# Patient Record
Sex: Female | Born: 1985 | Hispanic: Yes | Marital: Single | Smoking: Never smoker
Health system: Southern US, Community
[De-identification: ages and names within clinical notes are randomized; demographics above are authoritative.]

## PROBLEM LIST (undated history)

## (undated) DIAGNOSIS — E785 Hyperlipidemia, unspecified: Secondary | ICD-10-CM

## (undated) DIAGNOSIS — D649 Anemia, unspecified: Secondary | ICD-10-CM

## (undated) DIAGNOSIS — K649 Unspecified hemorrhoids: Secondary | ICD-10-CM

## (undated) HISTORY — DX: Hyperlipidemia, unspecified: E78.5

## (undated) HISTORY — DX: Unspecified hemorrhoids: K64.9

## (undated) HISTORY — PX: NO PAST SURGERIES: SHX2092

## (undated) HISTORY — DX: Anemia, unspecified: D64.9

---

## 2007-07-04 LAB — OB RESULTS CONSOLE RUBELLA ANTIBODY, IGM: Rubella: IMMUNE

## 2007-07-04 LAB — OB RESULTS CONSOLE VARICELLA ZOSTER ANTIBODY, IGG: Varicella: IMMUNE

## 2007-07-04 LAB — SICKLE CELL SCREEN: Sickle Cell Screen: NORMAL

## 2007-08-22 ENCOUNTER — Encounter: Payer: Self-pay | Admitting: Maternal & Fetal Medicine

## 2008-02-02 ENCOUNTER — Inpatient Hospital Stay: Payer: Self-pay

## 2008-02-12 IMAGING — US US OB DETAIL+14 WK - NRPT [facility]
1 series · 18 of 28 positions shown · non-contrast
Comparison: none

[Series 1: us ob detail+14 wk - nrpt [facility] · 18 of 91 slices shown]
[im 1/91]
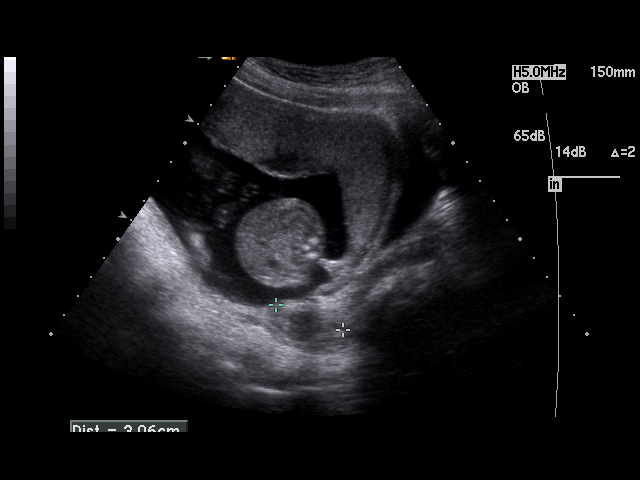
[im 7/91]
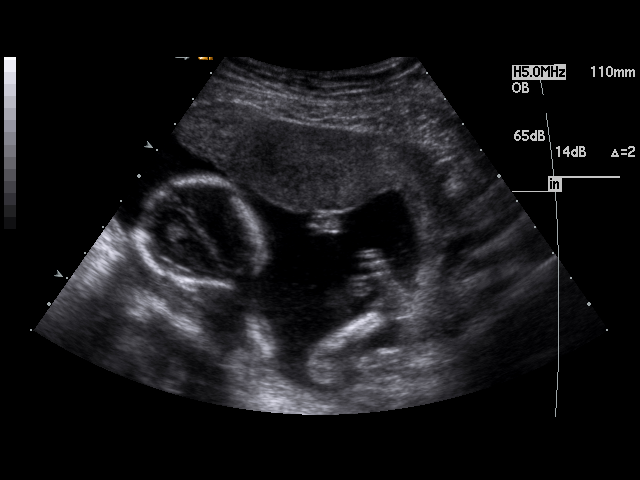
[im 11/91]
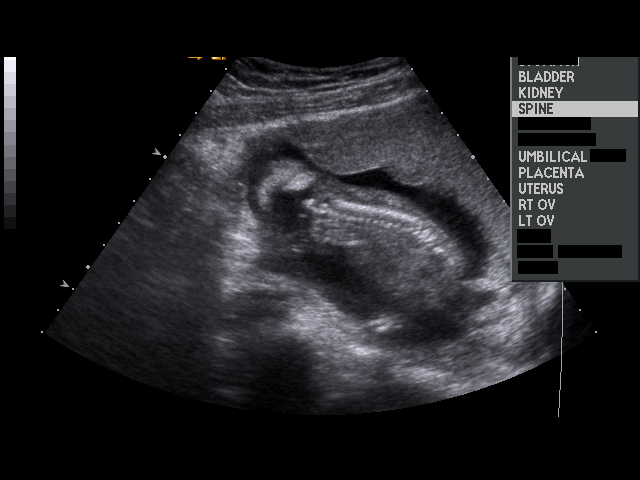
[im 17/91]
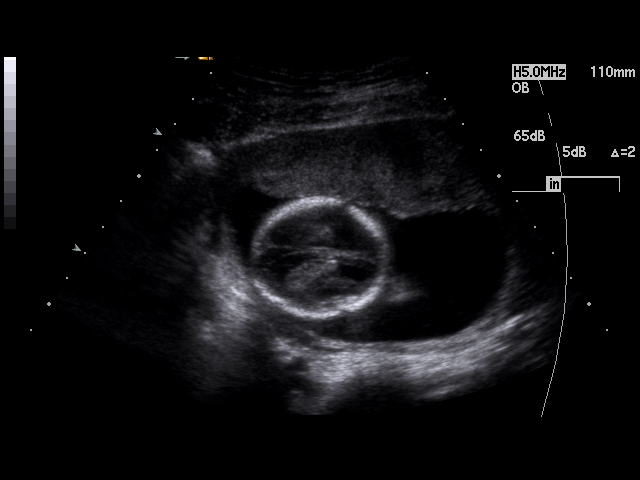
[im 24/91]
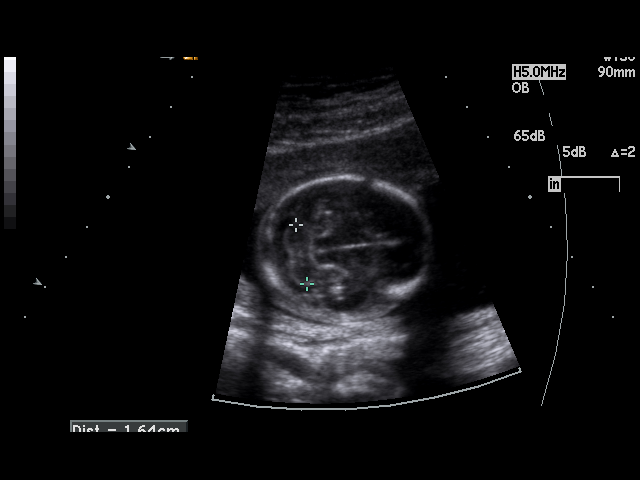
[im 27/91]
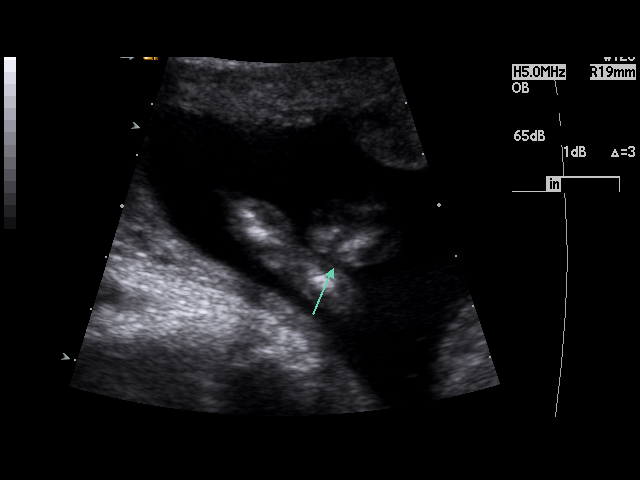
[im 34/91]
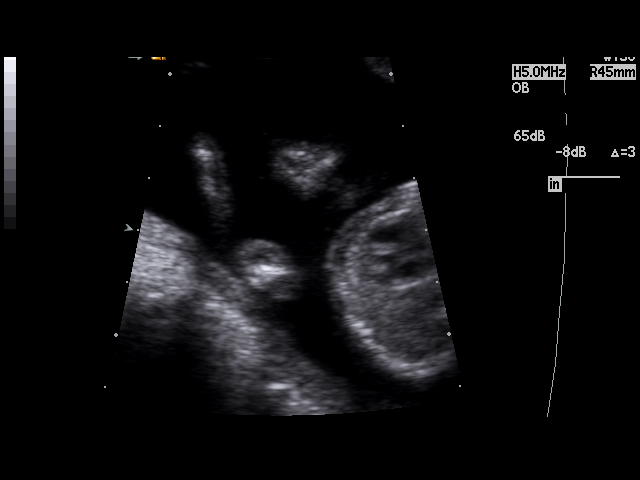
[im 37/91]
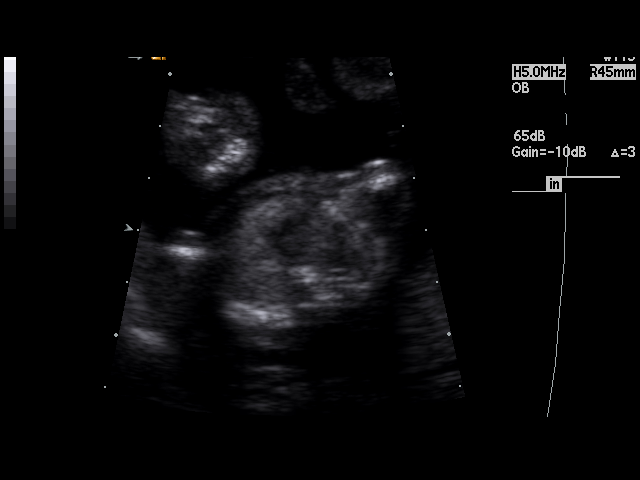
[im 44/91]
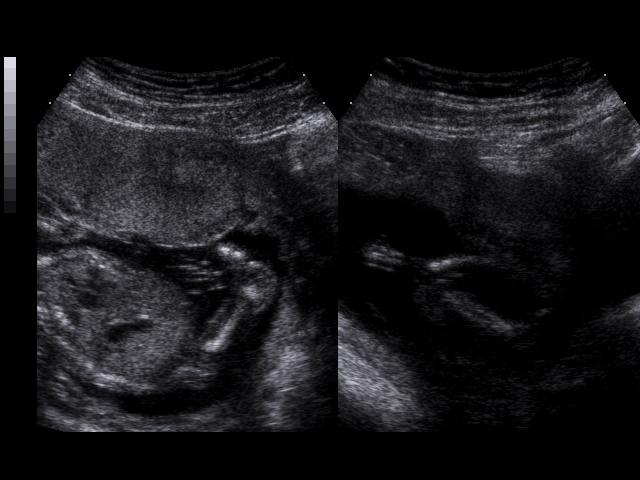
[im 47/91]
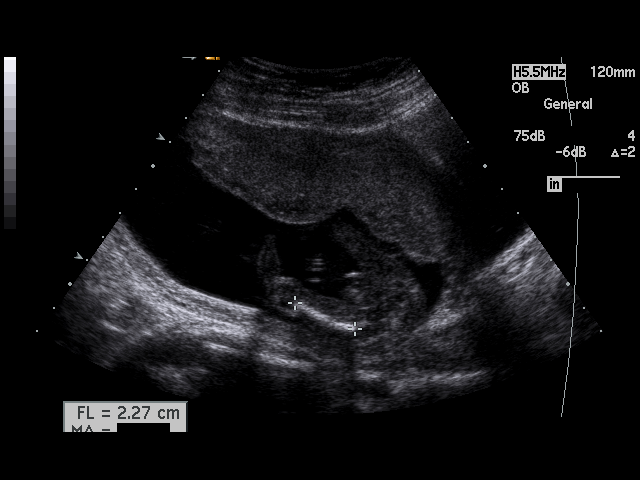
[im 54/91]
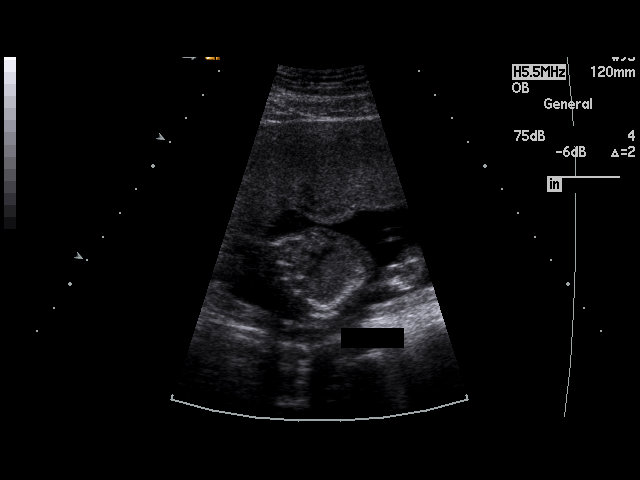
[im 57/91]
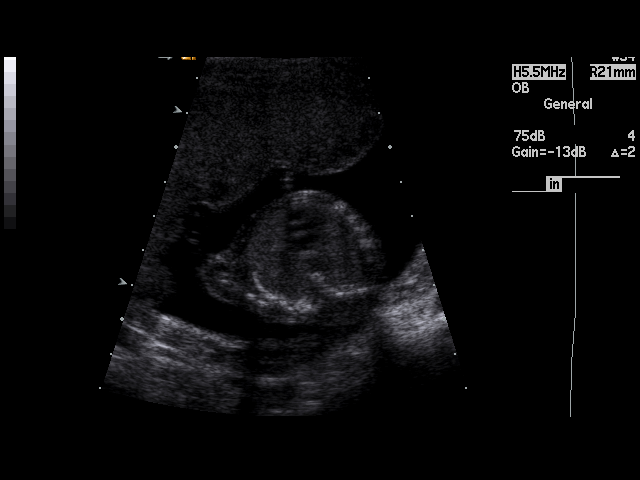
[im 64/91]
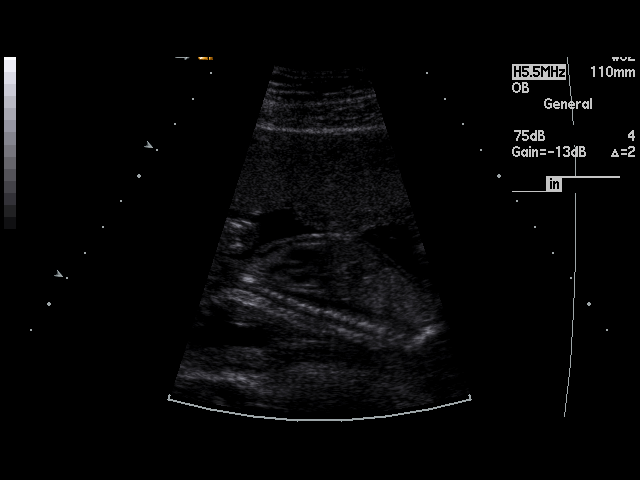
[im 71/91]
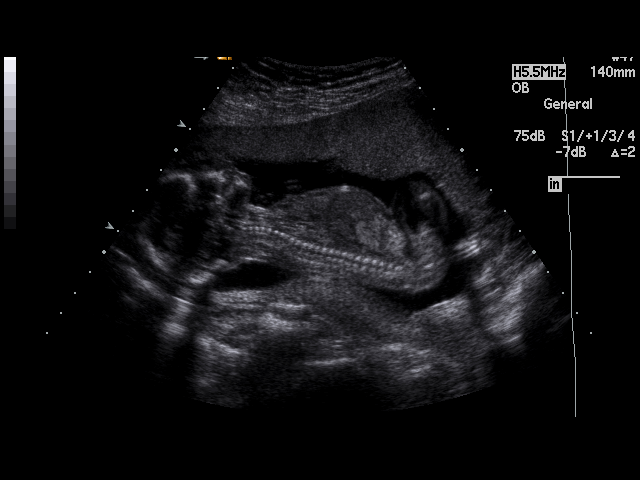
[im 74/91]
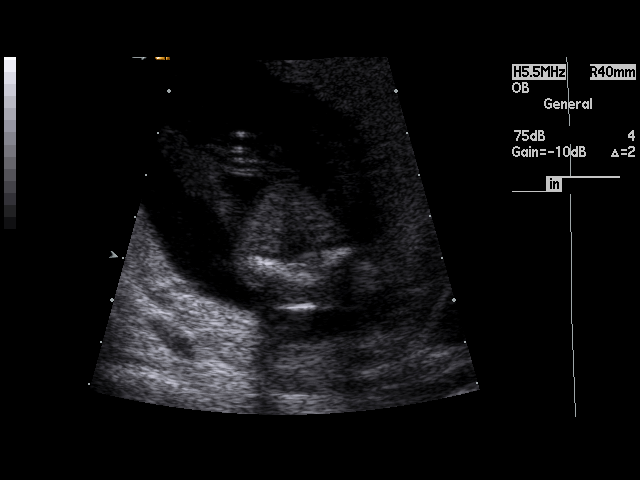
[im 81/91]
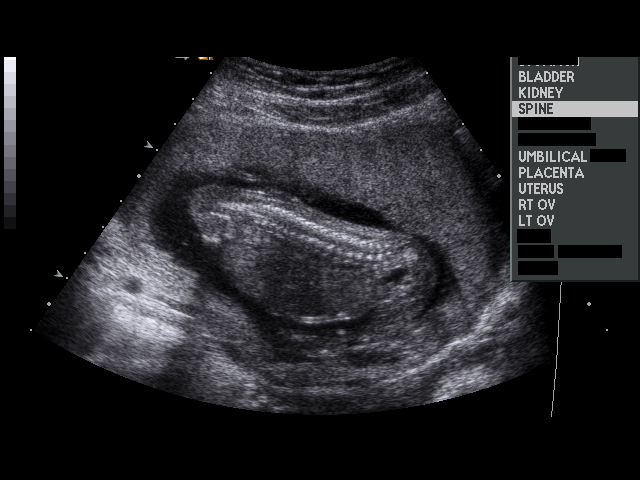
[im 84/91]
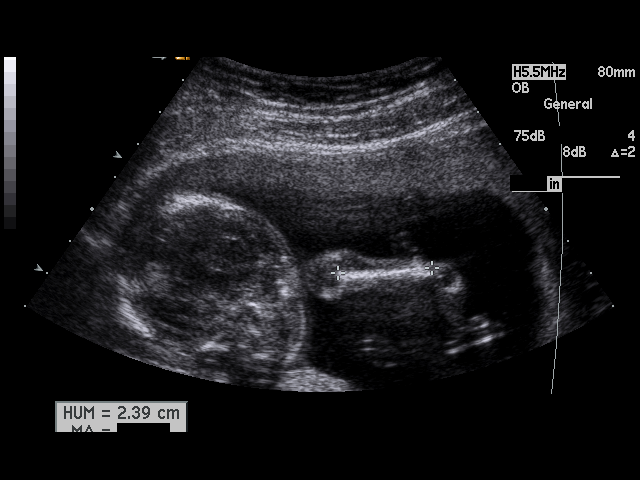
[im 91/91]
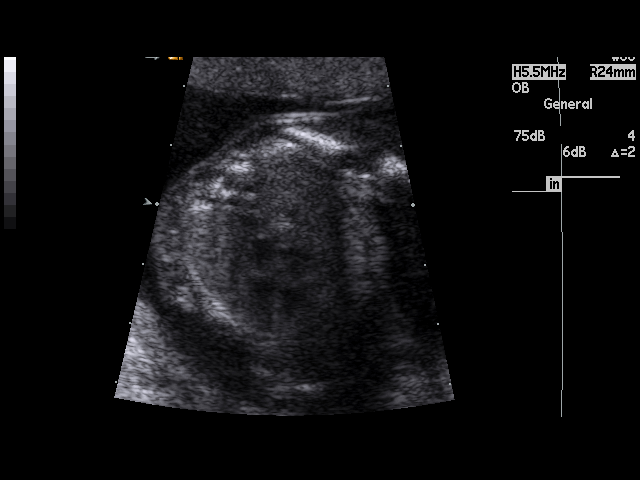

[18 of 28 positions shown; findings below may reference images not displayed]

IMAGES IMPORTED FROM THE SYNGO WORKFLOW SYSTEM
NO DICTATION FOR STUDY

## 2012-08-12 ENCOUNTER — Emergency Department: Payer: Self-pay | Admitting: Emergency Medicine

## 2012-08-12 LAB — URINALYSIS, COMPLETE
Bilirubin,UR: NEGATIVE
Ketone: NEGATIVE
Leukocyte Esterase: NEGATIVE
Nitrite: NEGATIVE
Protein: NEGATIVE
RBC,UR: <1 /HPF (ref 0–5)
Specific Gravity: 1.003 (ref 1.003–1.030)
Squamous Epithelial: <1
WBC UR: <1 /HPF (ref 0–5)

## 2012-08-12 LAB — CBC
HGB: 11.6 g/dL — ABNORMAL LOW (ref 12.0–16.0)
MCHC: 33.6 g/dL (ref 32.0–36.0)
MCV: 88 fL (ref 80–100)
Platelet: 261 10*3/uL (ref 150–440)
RDW: 13.5 % (ref 11.5–14.5)

## 2012-08-26 LAB — OB RESULTS CONSOLE ABO/RH

## 2012-08-26 LAB — OB RESULTS CONSOLE ANTIBODY SCREEN: Antibody Screen: NEGATIVE

## 2012-11-11 ENCOUNTER — Ambulatory Visit: Payer: Self-pay | Admitting: Family Medicine

## 2013-03-18 ENCOUNTER — Ambulatory Visit: Payer: Self-pay | Admitting: Advanced Practice Midwife

## 2013-04-02 ENCOUNTER — Inpatient Hospital Stay: Payer: Self-pay

## 2013-04-02 LAB — CBC WITH DIFFERENTIAL/PLATELET
Basophil #: 0 10*3/uL (ref 0.0–0.1)
Eosinophil #: 0.1 10*3/uL (ref 0.0–0.7)
HCT: 33.3 % — ABNORMAL LOW (ref 35.0–47.0)
HGB: 11.4 g/dL — ABNORMAL LOW (ref 12.0–16.0)
Lymphocyte %: 16.1 %
MCH: 30.6 pg (ref 26.0–34.0)
MCHC: 34.3 g/dL (ref 32.0–36.0)
MCV: 89 fL (ref 80–100)
Monocyte #: 0.7 x10 3/mm (ref 0.2–0.9)
Monocyte %: 5.5 %
RBC: 3.74 10*6/uL — ABNORMAL LOW (ref 3.80–5.20)
RDW: 13.9 % (ref 11.5–14.5)
WBC: 12.1 10*3/uL — ABNORMAL HIGH (ref 3.6–11.0)

## 2013-04-03 LAB — HEMOGLOBIN: HGB: 10 g/dL — ABNORMAL LOW (ref 12.0–16.0)

## 2014-01-21 LAB — HM HIV SCREENING LAB: HM HIV Screening: NEGATIVE

## 2017-06-08 LAB — HM PAP SMEAR: HM Pap smear: NEGATIVE

## 2017-07-25 ENCOUNTER — Ambulatory Visit: Payer: Self-pay | Attending: Oncology | Admitting: *Deleted

## 2017-07-25 ENCOUNTER — Ambulatory Visit
Admission: RE | Admit: 2017-07-25 | Discharge: 2017-07-25 | Disposition: A | Discharge status: Home / Self Care | Payer: Self-pay | Source: Ambulatory Visit | Attending: Oncology | Admitting: Oncology

## 2017-07-25 ENCOUNTER — Other Ambulatory Visit: Payer: Self-pay | Admitting: *Deleted

## 2017-07-25 ENCOUNTER — Encounter: Payer: Self-pay | Admitting: *Deleted

## 2017-07-25 ENCOUNTER — Encounter (INDEPENDENT_AMBULATORY_CARE_PROVIDER_SITE_OTHER): Payer: Self-pay

## 2017-07-25 VITALS — BP 101/66 | HR 71 | Temp 98.0°F | Ht 63.0 in | Wt 143.0 lb

## 2017-07-25 DIAGNOSIS — N63 Unspecified lump in unspecified breast: Secondary | ICD-10-CM

## 2017-07-25 DIAGNOSIS — N644 Mastodynia: Secondary | ICD-10-CM

## 2017-07-25 NOTE — Progress Notes (Signed)
Subjective:     Patient ID: Jordan Mccarthy, female   DOB: Mar 21, 1986, 32 y.o.   MRN: [id number]  HPI   Review of Systems     Objective:   Physical Exam  Pulmonary/Chest: Right breast exhibits tenderness. Right breast exhibits no inverted nipple, no mass, no nipple discharge and no skin change. Left breast exhibits no inverted nipple, no mass, no nipple discharge, no skin change and no tenderness. Breasts are symmetrical.         Assessment:     32 year old Hispanic female referred to [facility] by Jordan DallyKarla Leith, FNP at the [facility] for a right breast mass at 9:00.  Jordan Mccarthy, the interpreter present during the interview and exam.  On clinical breast exam bilateral breast have soft mobile fibroglandular like tissue at bilateral upper outer quadrants.  The right breast is tender to palpation.  I could not palpate a dominant mass.  Asked the patient to point to the area of concern.  She points to area of fibroglandular like tissue at 10:00 right breast.  Had the patient feel the symmetry between bilateral breast tissue.  She does state the right breast is more tender than the left to palpation.  States she has told her provider for 5 years about the pain and nodule, and this provider could also feel the nodule.  States she has had intermittent right breast pain for 5 years.  States it does not correlate to her menstrual cycle.  Taught self breast awareness.  Patient has been screened for eligibility.  She does not have any insurance, Medicare or Medicaid.  She also meets financial eligibility.  Hand-out given on the Affordable Care Act.    Plan:     Since the patient has targeted right breast pain, I will go ahead and get a ultrasound based on patient's age and protocol.  If the radiologist feels it necessary for an ultrasound we can order one.  Explained that if no findings on imaging, I feel the pain and nodule she is feeling is probably fibroglandular tissue.  Will follow-up per [facility] protocol.

## 2017-07-25 NOTE — Patient Instructions (Signed)
Gave patient hand-out, Women Staying Healthy, Active and Well from [facility], with education on breast health, pap smears, heart and colon health. 

## 2017-08-02 ENCOUNTER — Ambulatory Visit
Admission: RE | Admit: 2017-08-02 | Discharge: 2017-08-02 | Disposition: A | Discharge status: Home / Self Care | Payer: Self-pay | Source: Ambulatory Visit | Attending: Oncology | Admitting: Oncology

## 2017-08-02 DIAGNOSIS — N63 Unspecified lump in unspecified breast: Secondary | ICD-10-CM

## 2017-08-03 ENCOUNTER — Encounter: Payer: Self-pay | Admitting: *Deleted

## 2017-08-03 LAB — SURGICAL PATHOLOGY

## 2017-08-03 NOTE — Progress Notes (Signed)
Patient with benign breast biopsy.  Faxed results to provider via Epic.  Patient to return to [facility] as needed.  [facility] to [place].

## 2018-04-06 ENCOUNTER — Encounter: Payer: Self-pay | Admitting: Emergency Medicine

## 2018-04-06 ENCOUNTER — Other Ambulatory Visit: Payer: Self-pay

## 2018-04-06 ENCOUNTER — Emergency Department
Admission: EM | Admit: 2018-04-06 | Discharge: 2018-04-06 | Disposition: A | Discharge status: Home / Self Care | Payer: No Typology Code available for payment source | Attending: Emergency Medicine | Admitting: Emergency Medicine

## 2018-04-06 ENCOUNTER — Emergency Department: Payer: No Typology Code available for payment source

## 2018-04-06 DIAGNOSIS — Y939 Activity, unspecified: Secondary | ICD-10-CM | POA: Insufficient documentation

## 2018-04-06 DIAGNOSIS — M542 Cervicalgia: Secondary | ICD-10-CM | POA: Insufficient documentation

## 2018-04-06 DIAGNOSIS — Y998 Other external cause status: Secondary | ICD-10-CM | POA: Diagnosis not present

## 2018-04-06 DIAGNOSIS — M545 Low back pain: Secondary | ICD-10-CM | POA: Diagnosis not present

## 2018-04-06 DIAGNOSIS — Y92410 Unspecified street and highway as the place of occurrence of the external cause: Secondary | ICD-10-CM | POA: Insufficient documentation

## 2018-04-06 LAB — POCT PREGNANCY, URINE: PREG TEST UR: NEGATIVE

## 2018-04-06 MED ORDER — CYCLOBENZAPRINE HCL 5 MG PO TABS: 5.0000 mg | ORAL_TABLET | Freq: Three times a day (TID) | ORAL | 0 refills | Status: AC | PRN

## 2018-04-06 MED ORDER — MELOXICAM 15 MG PO TABS: 15.0000 mg | ORAL_TABLET | Freq: Every day | ORAL | 1 refills | Status: AC

## 2018-04-06 MED ORDER — KETOROLAC TROMETHAMINE 30 MG/ML IJ SOLN
30.0000 mg | Freq: Once | INTRAMUSCULAR | Status: AC
  Administered 2018-04-06: 30 mg via INTRAMUSCULAR
  Filled 2018-04-06: qty 1
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
## 2018-04-15 ENCOUNTER — Other Ambulatory Visit: Payer: Self-pay

## 2018-04-15 ENCOUNTER — Emergency Department
Admission: EM | Admit: 2018-04-15 | Discharge: 2018-04-15 | Disposition: A | Discharge status: Home / Self Care | Payer: Self-pay | Attending: Emergency Medicine | Admitting: Emergency Medicine

## 2018-04-15 DIAGNOSIS — M545 Low back pain: Secondary | ICD-10-CM | POA: Insufficient documentation

## 2018-04-15 DIAGNOSIS — M25512 Pain in left shoulder: Secondary | ICD-10-CM | POA: Insufficient documentation

## 2018-04-15 DIAGNOSIS — M549 Dorsalgia, unspecified: Secondary | ICD-10-CM | POA: Insufficient documentation

## 2018-04-15 DIAGNOSIS — M7918 Myalgia, other site: Secondary | ICD-10-CM | POA: Insufficient documentation

## 2018-04-15 MED ORDER — CYCLOBENZAPRINE HCL 10 MG PO TABS: 10.0000 mg | ORAL_TABLET | Freq: Three times a day (TID) | ORAL | 0 refills | Status: DC | PRN

## 2018-04-15 MED ORDER — TRAMADOL HCL 50 MG PO TABS: 50.0000 mg | ORAL_TABLET | Freq: Four times a day (QID) | ORAL | 0 refills | Status: AC | PRN
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
## 2018-05-16 ENCOUNTER — Encounter: Payer: Self-pay | Admitting: Obstetrics & Gynecology

## 2019-05-15 ENCOUNTER — Other Ambulatory Visit: Payer: Self-pay

## 2019-05-15 ENCOUNTER — Ambulatory Visit (LOCAL_COMMUNITY_HEALTH_CENTER): Payer: Self-pay

## 2019-05-15 VITALS — BP 95/66 | Ht 63.0 in | Wt 146.0 lb

## 2019-05-15 DIAGNOSIS — Z30011 Encounter for initial prescription of contraceptive pills: Secondary | ICD-10-CM

## 2019-05-15 DIAGNOSIS — Z3009 Encounter for other general counseling and advice on contraception: Secondary | ICD-10-CM

## 2019-05-15 MED ORDER — NORGESTIMATE-ETH ESTRADIOL 0.25-35 MG-MCG PO TABS: 1.0000 | ORAL_TABLET | Freq: Every day | ORAL | 11 refills | Status: DC

## 2019-05-15 MED ORDER — NORGESTIMATE-ETH ESTRADIOL 0.25-35 MG-MCG PO TABS: 1.0000 | ORAL_TABLET | Freq: Every day | ORAL | 0 refills | Status: DC

## 2019-05-15 NOTE — Progress Notes (Signed)
Client presents for additional ocps and physical overdue. Consult with E. Sciora CNM and following orders given: 1) may dispense Sprintec #3 and take one pil QD at same time every day and 2) client to schedule appt for physical when begins last pack of ocps. If agency not yet doing physicals, client to request appt with provider to discuss BCM. Client counseled regarding above and verbalized understanding. Rich Number, RN

## 2019-08-13 ENCOUNTER — Encounter: Payer: Self-pay | Admitting: Advanced Practice Midwife

## 2019-08-13 ENCOUNTER — Other Ambulatory Visit: Payer: Self-pay

## 2019-08-13 ENCOUNTER — Ambulatory Visit (LOCAL_COMMUNITY_HEALTH_CENTER): Payer: Self-pay | Admitting: Advanced Practice Midwife

## 2019-08-13 VITALS — BP 94/69 | Ht 63.0 in | Wt 154.4 lb

## 2019-08-13 DIAGNOSIS — E663 Overweight: Secondary | ICD-10-CM

## 2019-08-13 DIAGNOSIS — Z3041 Encounter for surveillance of contraceptive pills: Secondary | ICD-10-CM

## 2019-08-13 DIAGNOSIS — Z3009 Encounter for other general counseling and advice on contraception: Secondary | ICD-10-CM

## 2019-08-13 MED ORDER — NORGESTIMATE-ETH ESTRADIOL 0.25-35 MG-MCG PO TABS: 1.0000 | ORAL_TABLET | Freq: Every day | ORAL | 2 refills | Status: DC

## 2019-08-13 MED ORDER — NORGESTIMATE-ETH ESTRADIOL 0.25-35 MG-MCG PO TABS
1.0000 | ORAL_TABLET | Freq: Every day | ORAL | 11 refills | Status: DC
Start: 2019-08-13 — End: 2019-08-13

## 2019-08-13 NOTE — Progress Notes (Signed)
   [facility] problem visit  [facility]  Subjective:  Jordan Mccarthy is a 34 y.o. SHF G2P2 nonsmoker being seen today for more ocp's  Chief Complaint  Patient presents with  . Contraception    HPI Last sex 08/10/19.  Forgot to take ocp's on 08/09/19 and 08/10/19; took 2 ocp's 08/11/19.  LMP 07/25/19. Took last ocp last night. Pap due 05/2020  Does the patient have a current or past history of drug use? No   No components found for: HCV]   Health Maintenance Due  Topic Date Due  . TETANUS/TDAP  01/11/2007  . INFLUENZA VACCINE  02/22/2019    ROS  The following portions of the patient's history were reviewed and updated as appropriate: allergies, current medications, past family history, past medical history, past social history, past surgical history and problem list. Problem list updated.   See flowsheet for other program required questions.  Objective:   Vitals:   08/13/19 1550  BP: 94/69  Weight: 154 lb 6.4 oz (70 kg)  Height: 5\' 3"  (1.6 m)    Physical Exam  n/a  Assessment and Plan:  Jordan Mccarthy is a 34 y.o. female presenting to the [facility] Department for a Women's Health problem visit  1. Family planning  - norgestimate-ethinyl estradiol (ORTHO-CYCLEN, 28,) 0.25-35 MG-MCG tablet; Take 1 tablet by mouth daily.  Dispense: 1 Package; Refill: 2  2. Encounter for surveillance of contraceptive pills Ortho Cyclen #3 I po daily; pt counseled to take ocp same time daily.   Pt counseled if no menses at end of this pack then needs to do PT and call if + - norgestimate-ethinyl estradiol (ORTHO-CYCLEN, 28,) 0.25-35 MG-MCG tablet; Take 1 tablet by mouth daily.  Dispense: 1 Package; Refill: 2  3. Overweight      Return in about 3 months (around 11/11/2019), or Need to make sure is having menses and not pregnant (noncompliant).  No future appointments.  11/13/2019, CNM

## 2019-08-13 NOTE — Progress Notes (Signed)
In for more O.C.s; reports skipped pills Sat & Wynelle Link this week and took 2 on Monday Sharlette Dense, RN

## 2019-08-22 IMAGING — US US BREAST*R* LIMITED INC AXILLA
1 series · 9 of 9 positions shown · non-contrast
Comparison: None available.

CLINICAL DATA: Right breast upper outer quadrant focal pain.

EXAM:
ULTRASOUND OF THE RIGHT BREAST

[Series 1: us breast*right* limited inc axilla · 0.05mm/px · 9 of 9 slices shown]
[im 1/9]
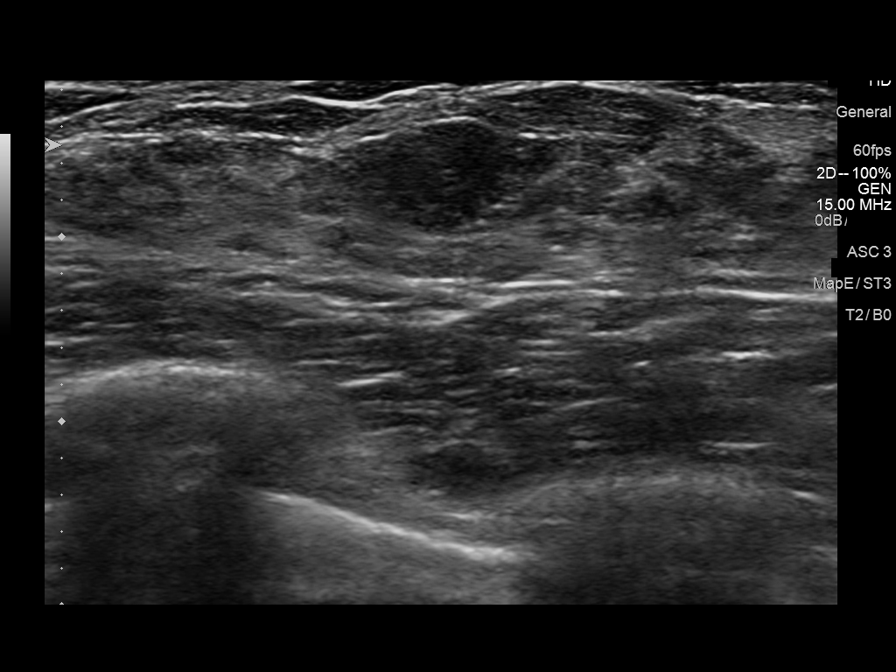
[im 2/9]
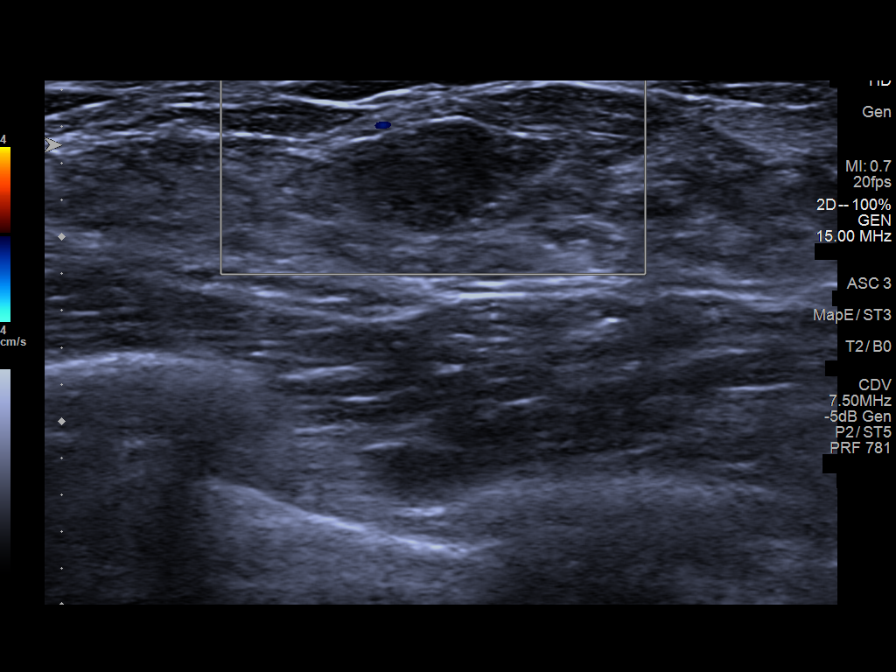
[im 3/9]
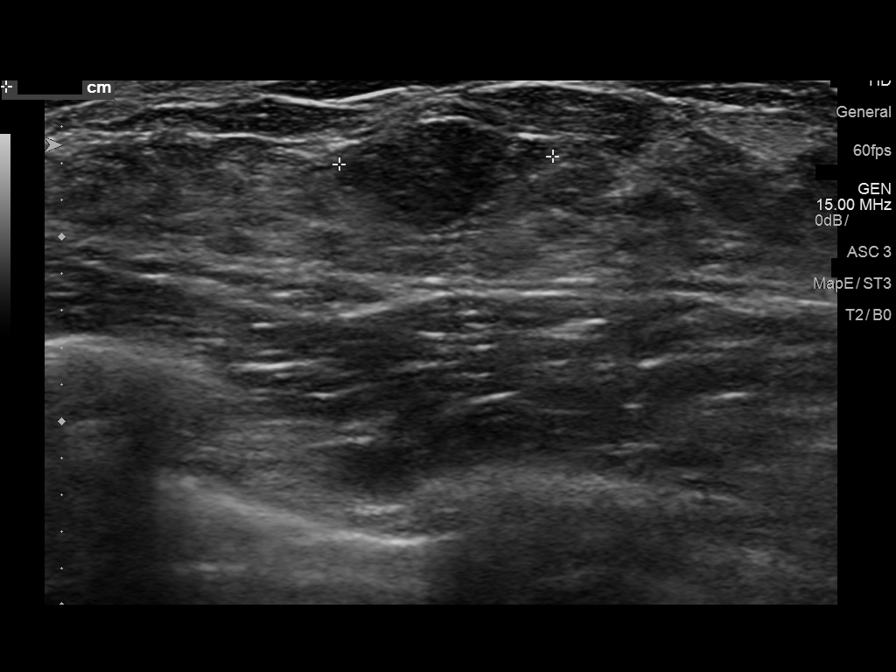
[im 4/9]
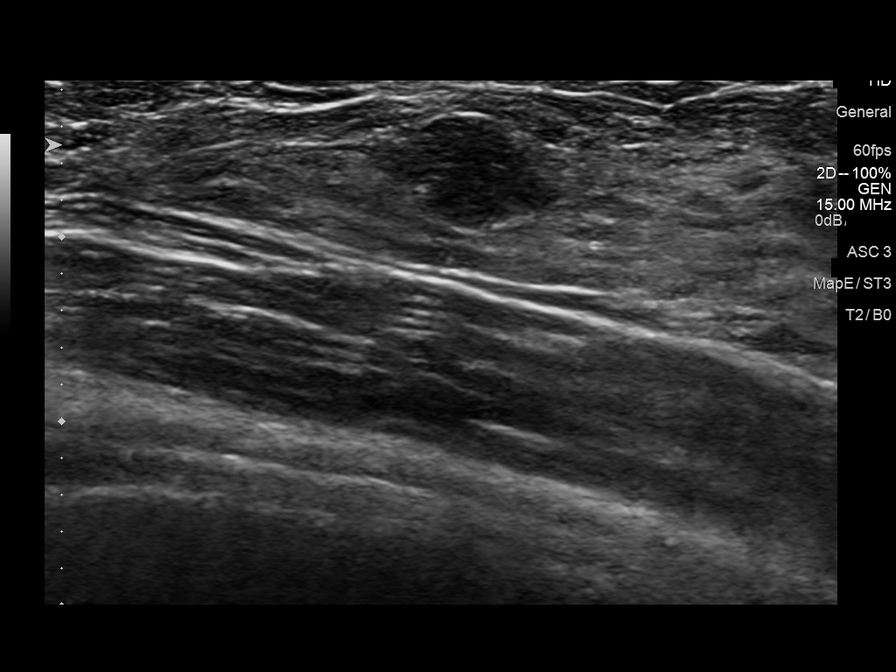
[im 5/9]
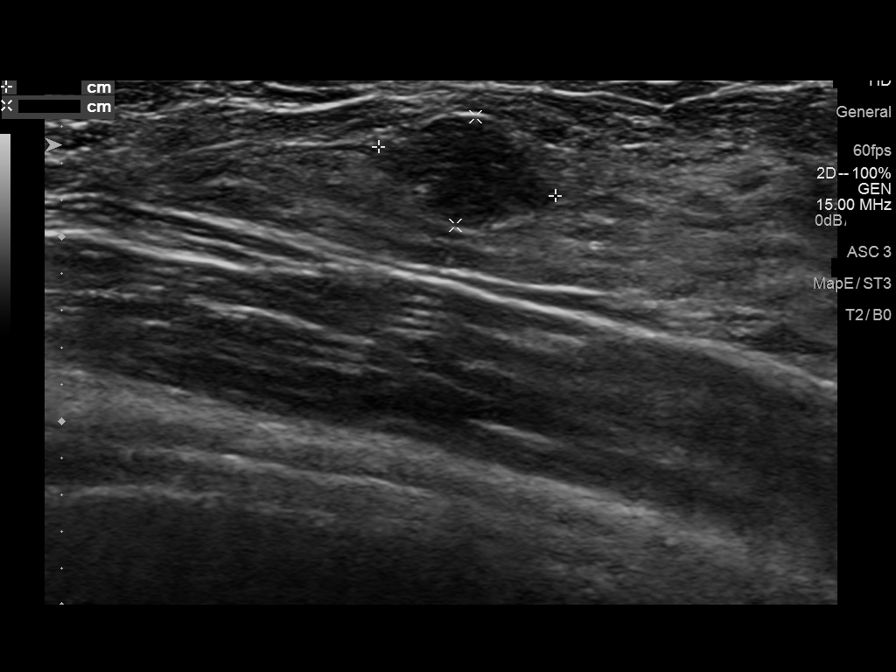
[im 6/9]
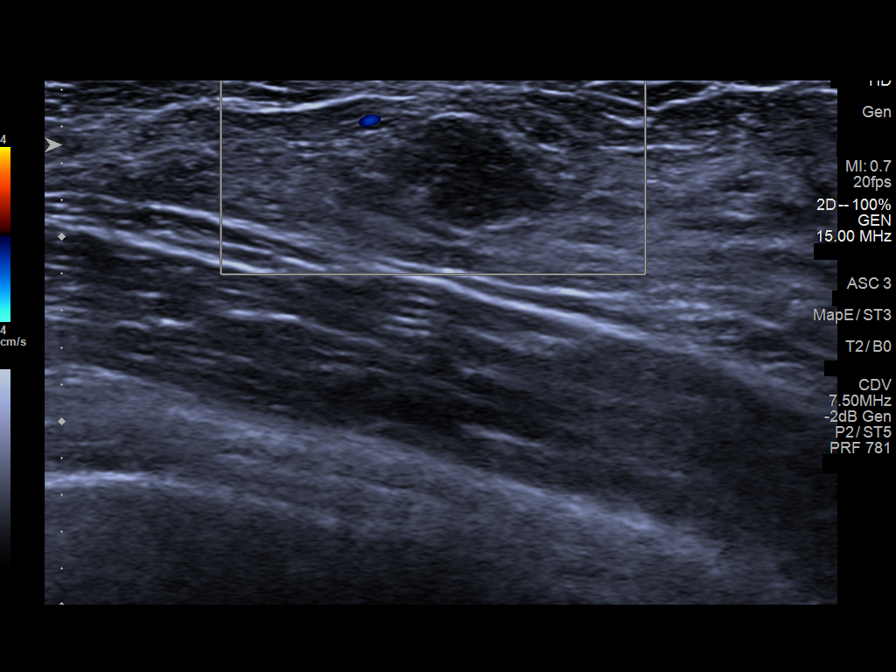
[im 7/9]
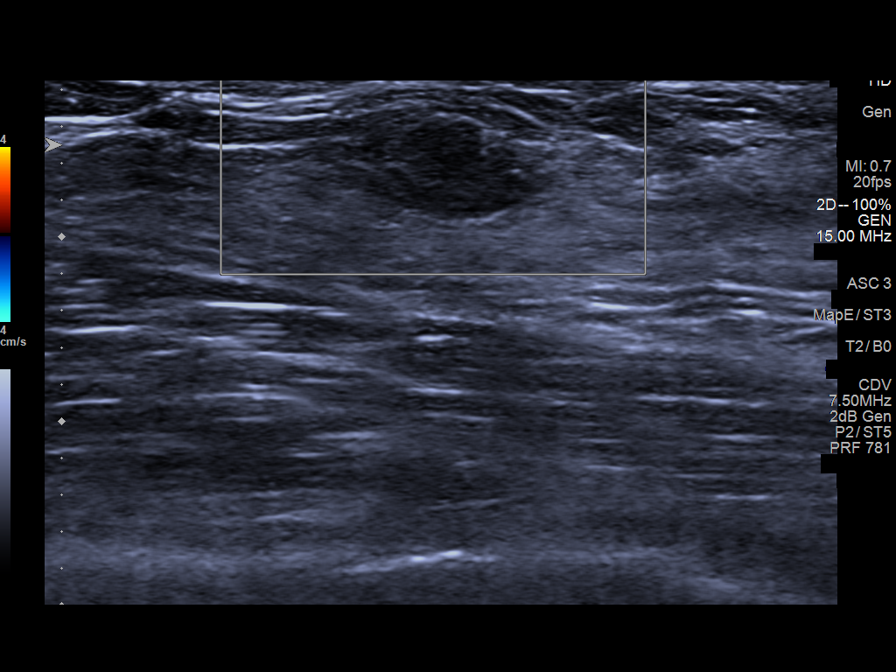
[im 8/9]
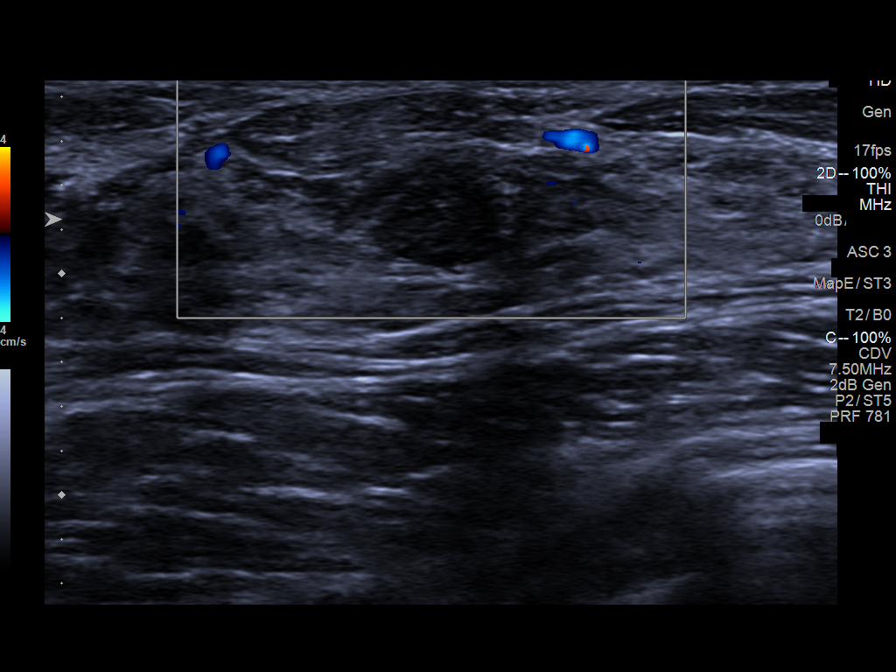
[im 9/9]
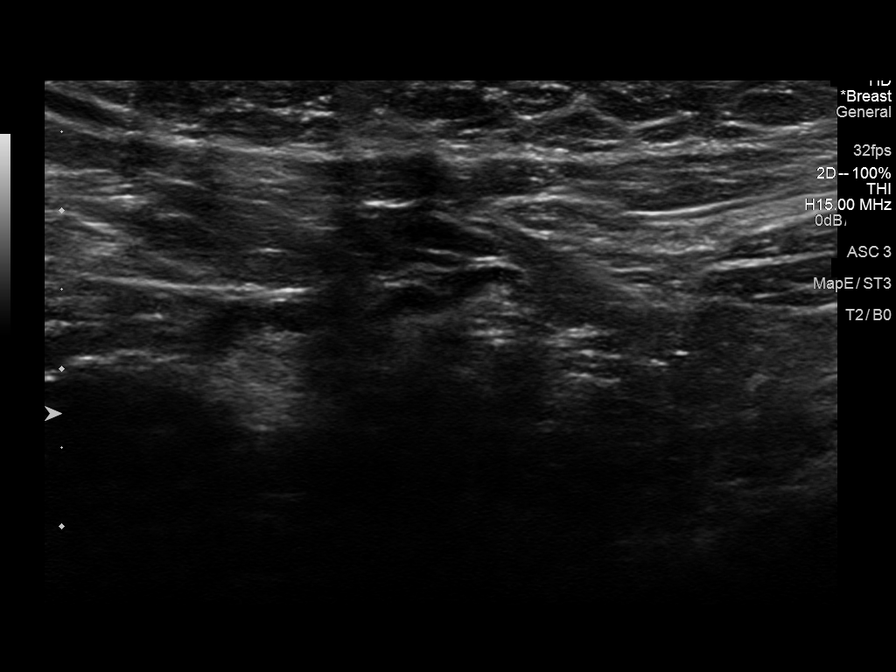

[9 of 9 positions shown; findings below may reference images not displayed]

FINDINGS: On physical exam, no suspicious masses are palpated.

Targeted ultrasound is performed, showing right breast 10 o'clock 6
cm from the nipple hypoechoic mostly circumscribed mass measuring
1.0 x 0.6 x 1.2 cm. No internal vascularity is documented.
IMPRESSION: Right breast 10 o'clock indeterminate mass, which corresponds to the
area of focal tenderness described by the patient, for which
ultrasound-guided core needle biopsy is recommended.

RECOMMENDATION:
Ultrasound-guided core needle biopsy of the right breast.

I have discussed the findings and recommendations with the patient.
Results were also provided in writing at the conclusion of the
visit. If applicable, a reminder letter will be sent to the patient
regarding the next appointment.

BI-RADS CATEGORY  4: Suspicious.

## 2019-10-22 ENCOUNTER — Other Ambulatory Visit: Payer: Self-pay | Admitting: Physician Assistant

## 2019-10-22 DIAGNOSIS — Z3041 Encounter for surveillance of contraceptive pills: Secondary | ICD-10-CM

## 2019-10-22 MED ORDER — NORGESTIMATE-ETH ESTRADIOL 0.25-35 MG-MCG PO TABS: 1.0000 | ORAL_TABLET | Freq: Every day | ORAL | 0 refills | Status: DC

## 2019-10-22 NOTE — Progress Notes (Signed)
Per chart review, last RP 04/2018.  Pap due 05/2020.  Provided that patient desires to continue with OCs and BP is normal, may have Sprintec(Ortho Cyclen) #8 to last until 05/2020, when she will need to have provider visit to have pap.

## 2019-10-23 ENCOUNTER — Ambulatory Visit: Payer: Self-pay

## 2019-10-23 ENCOUNTER — Other Ambulatory Visit: Payer: Self-pay

## 2019-10-23 VITALS — BP 107/68 | Ht 63.0 in | Wt 153.5 lb

## 2019-10-23 DIAGNOSIS — Z30011 Encounter for initial prescription of contraceptive pills: Secondary | ICD-10-CM

## 2019-10-23 DIAGNOSIS — Z3009 Encounter for other general counseling and advice on contraception: Secondary | ICD-10-CM

## 2019-10-23 MED ORDER — NORGESTIMATE-ETH ESTRADIOL 0.25-35 MG-MCG PO TABS: 1.0000 | ORAL_TABLET | Freq: Every day | ORAL | 2 refills | Status: DC

## 2019-10-23 NOTE — Progress Notes (Signed)
Client to received 8 packs of Sprintec at appt today per 10/22/2019 written order of [facility]. Due to expiration date, only able to dispense 6 packs. Client aware to return when begins last pack of pills and may receive 2 additional packs at that time. Client also counseled that will need provider appt in 05/2020 as PAP due. Jossie Ng, RN

## 2020-01-12 ENCOUNTER — Ambulatory Visit (LOCAL_COMMUNITY_HEALTH_CENTER): Payer: Self-pay

## 2020-01-12 ENCOUNTER — Other Ambulatory Visit: Payer: Self-pay

## 2020-01-12 VITALS — BP 107/71 | Ht 63.0 in | Wt 154.5 lb

## 2020-01-12 DIAGNOSIS — Z3201 Encounter for pregnancy test, result positive: Secondary | ICD-10-CM

## 2020-01-12 LAB — PREGNANCY, URINE: Preg Test, Ur: POSITIVE — AB

## 2020-01-12 MED ORDER — PRENATAL 27-0.8 MG PO TABS: 1.0000 | ORAL_TABLET | Freq: Every day | ORAL | 0 refills | Status: DC

## 2020-01-12 NOTE — Progress Notes (Signed)
UPT positive. Plans care at [facility]. To clerk for Medicaid for Preg. Women and to schedule prenatal appt. Jerel Shepherd, RN

## 2020-02-04 ENCOUNTER — Telehealth: Payer: Self-pay | Admitting: Family Medicine

## 2020-02-04 NOTE — Telephone Encounter (Signed)
Return call to client with [facility] [employer] (ID # [phone]) and left message to call with number to call provided. Client has [facility] new OB appt on 02/23/2020. Jossie Ng, RN

## 2020-02-05 ENCOUNTER — Telehealth: Payer: Self-pay

## 2020-02-05 NOTE — Telephone Encounter (Signed)
Returned call via interpreter V. Olmedo-reports was told in [facility] of weight loss & is wondering if should be concerned; reports weight 151 in [facility].  Informed from records since Jan. 2021-looks like weight was 153-154 so not as large a weight loss as client thought.  Denies vomiting, or diarrhea.  Reassured & to keep IP appt Sharlette Dense, RN

## 2020-02-23 ENCOUNTER — Encounter: Payer: Self-pay | Admitting: Advanced Practice Midwife

## 2020-02-23 ENCOUNTER — Other Ambulatory Visit: Payer: Self-pay

## 2020-02-23 ENCOUNTER — Ambulatory Visit: Payer: Medicaid Other | Admitting: Advanced Practice Midwife

## 2020-02-23 DIAGNOSIS — Z3481 Encounter for supervision of other normal pregnancy, first trimester: Secondary | ICD-10-CM | POA: Insufficient documentation

## 2020-02-23 DIAGNOSIS — Z3201 Encounter for pregnancy test, result positive: Secondary | ICD-10-CM

## 2020-02-23 DIAGNOSIS — K029 Dental caries, unspecified: Secondary | ICD-10-CM | POA: Insufficient documentation

## 2020-02-23 MED ORDER — PRENATAL 27-0.8 MG PO TABS: 1.0000 | ORAL_TABLET | Freq: Every day | ORAL | 0 refills | Status: DC
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
## 2020-02-23 NOTE — Progress Notes (Signed)
[facility] HEALTH DEPT [facility] [address] [place] [address] [phone]  INITIAL PRENATAL VISIT NOTE  Subjective:  Jordan Mccarthy is a 34 y.o. SHF G3P2000 at [redacted]w[redacted]d being seen today to start prenatal care at the [facility]. She feels "good" about surprise pregnancy with no birth control.  34 yo employed FOB feels "he wanted to" about pregnancy.  She is living with FOB and her 2 children (12, 7) and wants to "surprise" her children by telling them about this pregnancy by letting them hear the FHR.  LMP 12/07/19.  Denies ER use or u/s this pregnancy.  Last ETOH 06/2019 (1 bottle wine) only at "parties".  She is unemployed.  Last pap 06/11/2017 neg. She is currently monitored for the following issues for this low-risk pregnancy and has Overweight BMI=27.3 and Encounter for supervision of normal pregnancy in multigravida in first trimester on their problem list.  Patient reports no complaints.  Contractions: Not present. Vag. Bleeding: None.  Movement: Absent. Denies leaking of fluid.   Indications for ASA therapy (per uptodate) One of the following: Previous pregnancy with preeclampsia, especially early onset and with an adverse outcome No Multifetal gestation No Chronic hypertension No Type 1 or 2 diabetes mellitus No Chronic kidney disease No Autoimmune disease (antiphospholipid syndrome, systemic lupus erythematosus) No  Two or more of the following: Nulliparity No Obesity (body mass index >30 kg/m2) No Family history of preeclampsia in mother or sister No Age ?35 years No Sociodemographic characteristics (African American race, low socioeconomic level) No Personal risk factors (eg, previous pregnancy with low birth weight or small for gestational age infant, previous adverse pregnancy outcome [eg, stillbirth], interval >10 years between pregnancies) No   The following portions of the patient's history  were reviewed and updated as appropriate: allergies, current medications, past family history, past medical history, past social history, past surgical history and problem list. Problem list updated.  Objective:   Vitals:   02/23/20 1443  BP: 104/72  Temp: 98.1 F (36.7 C)  Weight: 153 lb 3.2 oz (69.5 kg)    Fetal Status: Fetal Heart Rate (bpm): 160 Fundal Height: 12 cm Movement: Absent  Presentation: Undeterminable   Physical Exam Vitals and nursing note reviewed.  Constitutional:      General: She is not in acute distress.    Appearance: Normal appearance. She is well-developed.  HENT:     Head: Normocephalic and atraumatic.     Right Ear: External ear normal.     Left Ear: External ear normal.     Nose: Nose normal. No congestion or rhinorrhea.     Mouth/Throat:     Lips: Pink.     Mouth: Mucous membranes are moist.     Dentition: Normal dentition. No dental caries (states went to dentist and has many caries; 4 missing teeth).     Pharynx: Oropharynx is clear. Uvula midline.  Eyes:     General: No scleral icterus.    Conjunctiva/sclera: Conjunctivae normal.  Neck:     Thyroid: No thyroid mass or thyromegaly.  Cardiovascular:     Rate and Rhythm: Normal rate.     Pulses: Normal pulses.     Comments: Extremities are warm and well perfused Pulmonary:     Effort: Pulmonary effort is normal.     Breath sounds: Normal breath sounds.  Chest:     Breasts: Breasts are symmetrical.        Right: Normal. No mass,  nipple discharge or skin change.        Left: Normal. No mass, nipple discharge or skin change.  Abdominal:     Palpations: Abdomen is soft.     Tenderness: There is no abdominal tenderness.     Comments: Gravid, soft without tenderness   Genitourinary:    General: Normal vulva.     Exam position: Lithotomy position.     Pubic Area: No rash.      Labia:        Right: No rash.        Left: No rash.      Vagina: Normal. No vaginal discharge (white creamy  leukorrhea, ph<4.5).     Cervix: No cervical motion tenderness or friability (friable to pap).     Uterus: Normal. Enlarged (Gravid 12 wk size). Not tender.      Adnexa: Right adnexa normal and left adnexa normal.     Rectum: Normal. No external hemorrhoid.     Comments: Pap done Musculoskeletal:     Right lower leg: No edema.     Left lower leg: No edema.  Lymphadenopathy:     Upper Body:     Right upper body: No axillary adenopathy.     Left upper body: No axillary adenopathy.  Skin:    General: Skin is warm.     Capillary Refill: Capillary refill takes less than 2 seconds.  Neurological:     Mental Status: She is alert.     Assessment and Plan:  Pregnancy: G3P2000 at [redacted]w[redacted]d  1. Encounter for supervision of normal pregnancy in multigravida in first trimester Desires Quad screen only Dating u/s ordered Encouraged pt to return to dentist for caries - Urine Culture - Chlamydia/GC NAA, Confirmation - HIV Antibody (routine testing w rflx) - Prenatal profile without Varicella or Rubella - 680321 Drug Screen - WET PREP FOR TRICH, YEAST, CLUE - Hemoglobin, venipuncture - Urinalysis (Urine Dip) - HCV Ab w/Rflx to Verification - IGP, Aptima HPV    Discussed overview of care and coordination with inpatient delivery practices including [facility], [facility], [facility] and [facility].   Reviewed Centering pregnancy as standard of care at [facility], oriented to room and showed video. Based on EDD, plan for Cycle    Preterm labor symptoms and general obstetric precautions including but not limited to vaginal bleeding, contractions, leaking of fluid and fetal movement were reviewed in detail with the patient.  Please refer to After Visit Summary for other counseling recommendations.   No follow-ups on file.  No future appointments.  Alberteen Spindle, CNM

## 2020-02-24 ENCOUNTER — Encounter: Payer: Self-pay | Admitting: Advanced Practice Midwife

## 2020-02-24 LAB — 789231 7+OXYCODONE-BUND
Amphetamines, Urine: NEGATIVE ng/mL
BENZODIAZ UR QL: NEGATIVE ng/mL
Barbiturate screen, urine: NEGATIVE ng/mL
Cannabinoid Quant, Ur: NEGATIVE ng/mL
Cocaine (Metab.): NEGATIVE ng/mL
OPIATE SCREEN URINE: NEGATIVE ng/mL
Oxycodone/Oxymorphone, Urine: NEGATIVE ng/mL
PCP Quant, Ur: NEGATIVE ng/mL

## 2020-02-24 LAB — URINALYSIS
Bilirubin, UA: NEGATIVE
Glucose, UA: NEGATIVE
Ketones, UA: NEGATIVE
Leukocytes,UA: NEGATIVE
Nitrite, UA: NEGATIVE
Protein,UA: NEGATIVE
RBC, UA: NEGATIVE
Specific Gravity, UA: 1.015 (ref 1.005–1.030)
Urobilinogen, Ur: 0.2 mg/dL (ref 0.2–1.0)
pH, UA: 7 (ref 5.0–7.5)

## 2020-02-24 LAB — CBC/D/PLT+RPR+RH+ABO+AB SCR
Antibody Screen: NEGATIVE
Basophils Absolute: 0 10*3/uL (ref 0.0–0.2)
Basos: 0 %
EOS (ABSOLUTE): 0.1 10*3/uL (ref 0.0–0.4)
Eos: 1 %
Hematocrit: 36.2 % (ref 34.0–46.6)
Hemoglobin: 12.2 g/dL (ref 11.1–15.9)
Hepatitis B Surface Ag: NEGATIVE
Immature Grans (Abs): 0 10*3/uL (ref 0.0–0.1)
Immature Granulocytes: 0 %
Lymphocytes Absolute: 2.2 10*3/uL (ref 0.7–3.1)
Lymphs: 24 %
MCH: 29.6 pg (ref 26.6–33.0)
MCHC: 33.7 g/dL (ref 31.5–35.7)
MCV: 88 fL (ref 79–97)
Monocytes Absolute: 0.5 10*3/uL (ref 0.1–0.9)
Monocytes: 5 %
Neutrophils Absolute: 6.4 10*3/uL (ref 1.4–7.0)
Neutrophils: 70 %
Platelets: 340 10*3/uL (ref 150–450)
RBC: 4.12 x10E6/uL (ref 3.77–5.28)
RDW: 13.6 % (ref 11.7–15.4)
RPR Ser Ql: NONREACTIVE
Rh Factor: POSITIVE
WBC: 9.2 10*3/uL (ref 3.4–10.8)

## 2020-02-24 LAB — HCV INTERPRETATION

## 2020-02-24 LAB — HCV AB W/RFLX TO VERIFICATION: HCV Ab: <0.1 s/co ratio (ref 0.0–0.9)

## 2020-02-24 LAB — HIV ANTIBODY (ROUTINE TESTING W REFLEX): HIV Screen 4th Generation wRfx: NONREACTIVE

## 2020-02-24 LAB — WET PREP FOR TRICH, YEAST, CLUE
Trichomonas Exam: NEGATIVE
Yeast Exam: NEGATIVE

## 2020-02-24 LAB — HEMOGLOBIN, FINGERSTICK: Hemoglobin: 12.5 g/dL (ref 11.1–15.9)

## 2020-02-25 LAB — IGP, APTIMA HPV
HPV Aptima: NEGATIVE
PAP Smear Comment: 0

## 2020-02-25 LAB — CHLAMYDIA/GC NAA, CONFIRMATION
Chlamydia trachomatis, NAA: NEGATIVE
Neisseria gonorrhoeae, NAA: NEGATIVE

## 2020-02-25 LAB — URINE CULTURE: Organism ID, Bacteria: NO GROWTH

## 2020-02-26 ENCOUNTER — Other Ambulatory Visit: Payer: Self-pay | Admitting: Advanced Practice Midwife

## 2020-02-26 ENCOUNTER — Telehealth: Payer: Self-pay | Admitting: Student

## 2020-02-26 DIAGNOSIS — Z3481 Encounter for supervision of other normal pregnancy, first trimester: Secondary | ICD-10-CM

## 2020-02-26 NOTE — Telephone Encounter (Signed)
Apt scheduled for 1st trimester [place] at [facility] location- 11am at Outpatient imaging. Sharlyne Pacas, RN

## 2020-02-26 NOTE — Telephone Encounter (Signed)
LM with interpreter to [facility] to [facility]. Has 1st trimester [place] apt at [facility] at 11am at Outpatient Imaging. Must arrive at 10:45am to meet with interpreter. Sharlyne Pacas, RN

## 2020-03-04 ENCOUNTER — Other Ambulatory Visit: Payer: Self-pay

## 2020-03-04 ENCOUNTER — Ambulatory Visit
Admission: RE | Admit: 2020-03-04 | Discharge: 2020-03-04 | Disposition: A | Discharge status: Home / Self Care | Payer: Self-pay | Source: Ambulatory Visit | Attending: Advanced Practice Midwife | Admitting: Advanced Practice Midwife

## 2020-03-04 ENCOUNTER — Other Ambulatory Visit: Payer: Self-pay | Admitting: Advanced Practice Midwife

## 2020-03-04 DIAGNOSIS — Z3481 Encounter for supervision of other normal pregnancy, first trimester: Secondary | ICD-10-CM

## 2020-03-08 ENCOUNTER — Encounter: Payer: Self-pay | Admitting: Advanced Practice Midwife

## 2020-03-22 ENCOUNTER — Ambulatory Visit: Payer: Medicaid Other | Admitting: Family Medicine

## 2020-03-22 ENCOUNTER — Other Ambulatory Visit: Payer: Self-pay

## 2020-03-22 ENCOUNTER — Encounter: Payer: Self-pay | Admitting: Family Medicine

## 2020-03-22 VITALS — BP 104/59 | Temp 97.1°F | Wt 150.0 lb

## 2020-03-22 DIAGNOSIS — E663 Overweight: Secondary | ICD-10-CM

## 2020-03-22 DIAGNOSIS — Z3481 Encounter for supervision of other normal pregnancy, first trimester: Secondary | ICD-10-CM | POA: Diagnosis not present

## 2020-03-22 NOTE — Progress Notes (Signed)
Patient here for MH RV at 15 4/7. Desires Quad screen today.Burt Knack, RN

## 2020-03-22 NOTE — Progress Notes (Signed)
   PRENATAL VISIT NOTE  Subjective:  Jordan Mccarthy is a 34 y.o. G3P2002 at [redacted]w[redacted]d being seen today for ongoing prenatal care.  She is currently monitored for the following issues for this low-risk pregnancy and has Overweight BMI=27.3; Encounter for supervision of normal pregnancy in multigravida in first trimester; and Dental caries on their problem list.  Patient reports no complaints.  Contractions: Not present. Vag. Bleeding: None.  Movement: Absent. Denies leaking of fluid/ROM.   The following portions of the patient's history were reviewed and updated as appropriate: allergies, current medications, past family history, past medical history, past social history, past surgical history and problem list. Problem list updated.  Objective:   Vitals:   03/22/20 0904  BP: (!) 104/59  Temp: (!) 97.1 F (36.2 C)  Weight: 150 lb (68 kg)    Fetal Status: Fetal Heart Rate (bpm): 155 Fundal Height: 15 cm Movement: Absent     General:  Alert, oriented and cooperative. Patient is in no acute distress.  Skin: Skin is warm and dry. No rash noted.   Cardiovascular: Normal heart rate noted  Respiratory: Normal respiratory effort, no problems with respiration noted  Abdomen: Soft, gravid, appropriate for gestational age.  Pain/Pressure: Absent     Pelvic: Cervical exam deferred        Extremities: Normal range of motion.  Edema: None  Mental Status: Normal mood and affect. Normal behavior. Normal judgment and thought content.   Assessment and Plan:  Pregnancy: G3P2002 at [redacted]w[redacted]d    1. Encounter for supervision of normal pregnancy in multigravida in first trimester -Up to date.  -Quad today. Referral placed today for anatomy US at [facility]. - QUAD Screen [facility] Only  2. Overweight BMI=27.3 -7 lb (-3.175 kg) States she is feeling well, no nausea, eating well.    Preterm labor symptoms and general obstetric precautions including but not limited to vaginal bleeding, contractions, leaking of fluid  and fetal movement were reviewed in detail with the patient. Please refer to After Visit Summary for other counseling recommendations.  Return in about 4 weeks (around 04/19/2020) for routine prenatal care.  Future Appointments  Date Time Provider Department Center  04/19/2020  9:00 AM [facility] PROVIDER [facility] None    Ann Held, PA-C

## 2020-03-31 ENCOUNTER — Other Ambulatory Visit: Payer: Self-pay | Admitting: Family Medicine

## 2020-03-31 DIAGNOSIS — Z3482 Encounter for supervision of other normal pregnancy, second trimester: Secondary | ICD-10-CM

## 2020-04-05 ENCOUNTER — Telehealth: Payer: Self-pay

## 2020-04-05 NOTE — Telephone Encounter (Signed)
TC to patient to make sure she is aware of [facility] U/S on 04/07/2020 at 1:00pm. LM with number to call if she has questions. Interpreter, M. [place].Burt Knack, RN

## 2020-04-05 NOTE — Telephone Encounter (Signed)
Patient returned call and talked with Rennis Harding. Per JV patient states she is aware of [facility] U/S.Marland KitchenBurt Knack, RN

## 2020-04-07 ENCOUNTER — Ambulatory Visit
Admission: RE | Admit: 2020-04-07 | Discharge: 2020-04-07 | Disposition: A | Discharge status: Home / Self Care | Payer: Self-pay | Source: Ambulatory Visit | Attending: Family Medicine | Admitting: Family Medicine

## 2020-04-07 ENCOUNTER — Other Ambulatory Visit: Payer: Self-pay

## 2020-04-07 DIAGNOSIS — Z3482 Encounter for supervision of other normal pregnancy, second trimester: Secondary | ICD-10-CM | POA: Insufficient documentation

## 2020-04-09 ENCOUNTER — Encounter: Payer: Self-pay | Admitting: Family Medicine

## 2020-04-09 DIAGNOSIS — Z3481 Encounter for supervision of other normal pregnancy, first trimester: Secondary | ICD-10-CM

## 2020-04-19 ENCOUNTER — Encounter: Payer: Self-pay | Admitting: Family Medicine

## 2020-04-19 ENCOUNTER — Other Ambulatory Visit: Payer: Self-pay

## 2020-04-19 ENCOUNTER — Ambulatory Visit: Payer: Self-pay | Admitting: Family Medicine

## 2020-04-19 VITALS — BP 102/75 | HR 74 | Temp 97.2°F | Wt 161.4 lb

## 2020-04-19 DIAGNOSIS — Z3482 Encounter for supervision of other normal pregnancy, second trimester: Secondary | ICD-10-CM | POA: Diagnosis not present

## 2020-04-19 DIAGNOSIS — Z3481 Encounter for supervision of other normal pregnancy, first trimester: Secondary | ICD-10-CM

## 2020-04-19 NOTE — Progress Notes (Signed)
   PRENATAL VISIT NOTE  Subjective:  Jordan Mccarthy is a 34 y.o. G3P2002 at [redacted]w[redacted]d being seen today for ongoing prenatal care.  She is currently monitored for the following issues for this low-risk pregnancy and has Overweight BMI=27.3; Encounter for supervision of normal pregnancy in multigravida in first trimester; and Dental caries on their problem list.  Patient reports no complaints.  Contractions: Not present. Vag. Bleeding: None.  Movement: Present. Denies leaking of fluid/ROM.   The following portions of the patient's history were reviewed and updated as appropriate: allergies, current medications, past family history, past medical history, past social history, past surgical history and problem list. Problem list updated.  Objective:   Vitals:   04/19/20 0937  BP: 102/75  Pulse: 74  Temp: (!) 97.2 F (36.2 C)  Weight: 161 lb 6.4 oz (73.2 kg)     Fetal Status: Fetal Heart Rate (bpm): 150 Fundal Height: 20 cm Movement: Present     General:  Alert, oriented and cooperative. Patient is in no acute distress.  Skin: Skin is warm and dry. No rash noted.   Cardiovascular: Normal heart rate noted  Respiratory: Normal respiratory effort, no problems with respiration noted  Abdomen: Soft, gravid, appropriate for gestational age.  Pain/Pressure: Absent     Pelvic: Cervical exam deferred        Extremities: Normal range of motion.  Edema: None  Mental Status: Normal mood and affect. Normal behavior. Normal judgment and thought content.   Assessment and Plan:  Pregnancy: G3P2002 at [redacted]w[redacted]d    1. Encounter for supervision of normal pregnancy in multigravida in first trimester -Up to date. Reviewed anatomy US from 9/15 w/3VC, posterior placenta, WNL. Negative quad screen.  -Pt weight is4 lb 6.4 oz (1.996 kg) - pt states she is eating well, no n/v.   Preterm labor symptoms and general obstetric precautions including but not limited to vaginal bleeding, contractions, leaking of fluid  and fetal movement were reviewed in detail with the patient. Please refer to After Visit Summary for other counseling recommendations.  Return in about 4 weeks (around 05/17/2020) for routine prenatal care.  Future Appointments  Date Time Provider Department Center  05/10/2020  9:00 AM [facility] PROVIDER [facility] None    Ann Held, PA-C

## 2020-04-19 NOTE — Progress Notes (Signed)
Patient here for MH RV at 19 1/7. Patient sent to clerical to schedule 4 week MH RV.Burt Knack, RN

## 2020-05-10 ENCOUNTER — Other Ambulatory Visit: Payer: Self-pay

## 2020-05-10 ENCOUNTER — Ambulatory Visit: Payer: Self-pay | Admitting: Family Medicine

## 2020-05-10 ENCOUNTER — Encounter: Payer: Self-pay | Admitting: Family Medicine

## 2020-05-10 VITALS — BP 107/60 | HR 80 | Temp 97.6°F | Wt 164.4 lb

## 2020-05-10 DIAGNOSIS — E663 Overweight: Secondary | ICD-10-CM

## 2020-05-10 DIAGNOSIS — Z3481 Encounter for supervision of other normal pregnancy, first trimester: Secondary | ICD-10-CM

## 2020-05-10 NOTE — Progress Notes (Signed)
   PRENATAL VISIT NOTE  Subjective:  Jordan Mccarthy is a 34 y.o. G3P2002 at [redacted]w[redacted]d being seen today for ongoing prenatal care.  She is currently monitored for the following issues for this low-risk pregnancy and has Overweight BMI=27.3; Encounter for supervision of normal pregnancy in multigravida in first trimester; and Dental caries on their problem list.  Patient reports she has had hx of asthma, has been getting short winded after 20 steps or so. Denies wheeze, palpitations or CP. No leg pain, swelling or tenderness.     Contractions: Not present. Vag. Bleeding: None.  Movement: Present. Denies leaking of fluid/ROM.   The following portions of the patient's history were reviewed and updated as appropriate: allergies, current medications, past family history, past medical history, past social history, past surgical history and problem list. Problem list updated.  Objective:   Vitals:   05/10/20 0853  BP: 107/60  Pulse: 80  Temp: 97.6 F (36.4 C)  Weight: 164 lb 6.4 oz (74.6 kg)   PO2 sitting: 100%    Walking: 100% Pulse sitting: 71        Walking: 92  Fetal Status: Fetal Heart Rate (bpm): 150 Fundal Height: 22 cm Movement: Present     General:  Alert, oriented and cooperative. Patient is in no acute distress.  Skin: Skin is warm and dry. No rash noted.   Cardiovascular: Normal heart rate noted  Respiratory: Normal respiratory effort, no problems with respiration noted. Lungs CTAB.  Abdomen: Soft, gravid, appropriate for gestational age.  Pain/Pressure: Absent     Pelvic: Cervical exam deferred        Extremities: Normal range of motion.  Edema: None. No redness, swelling, tenderness of bilateral calves.  Mental Status: Normal mood and affect. Normal behavior. Normal judgment and thought content.   Assessment and Plan:  Pregnancy: G3P2002 at [redacted]w[redacted]d    1. Encounter for supervision of normal pregnancy in multigravida in first trimester -Up to date. Anatomy US from 9/15 WNL.  Neg quad screen.  -Regarding occasional SOB: vitals wnl (PO2 100% both sitting and walking, pulse 71 sitting and 92 walking), exam normal. Reassurance given this is likely normal physiologic dyspnea of pregnancy. However, advised to go to ER if SOB ever acute or persistant or if any CP/palpitations. Offered albuterol inhaler as pt has remote hx of asthma, she declines.   2. Overweight BMI=27.3 7 lb 6.4 oz (3.357 kg)     Preterm labor symptoms and general obstetric precautions including but not limited to vaginal bleeding, contractions, leaking of fluid and fetal movement were reviewed in detail with the patient. Please refer to After Visit Summary for other counseling recommendations.  Return in about 4 weeks (around 06/07/2020) for routine prenatal care.  Future Appointments  Date Time Provider Department Center  06/07/2020  8:20 AM [facility] PROVIDER [facility] None    Ann Held, PA-C

## 2020-05-10 NOTE — Progress Notes (Signed)
Presents for MH RV at 22.1 weeks. Takes PNV daily, denies ED/Hospital visit. Accepts flu shot today, tolerated well. Sharlyne Pacas, RN

## 2020-06-07 ENCOUNTER — Ambulatory Visit: Payer: Self-pay | Admitting: Family Medicine

## 2020-06-07 ENCOUNTER — Other Ambulatory Visit: Payer: Self-pay

## 2020-06-07 ENCOUNTER — Encounter: Payer: Self-pay | Admitting: Family Medicine

## 2020-06-07 VITALS — BP 96/59 | HR 74 | Temp 97.1°F | Wt 171.4 lb

## 2020-06-07 DIAGNOSIS — E663 Overweight: Secondary | ICD-10-CM

## 2020-06-07 DIAGNOSIS — K029 Dental caries, unspecified: Secondary | ICD-10-CM

## 2020-06-07 DIAGNOSIS — Z3481 Encounter for supervision of other normal pregnancy, first trimester: Secondary | ICD-10-CM

## 2020-06-07 DIAGNOSIS — Z3482 Encounter for supervision of other normal pregnancy, second trimester: Secondary | ICD-10-CM

## 2020-06-07 MED ORDER — PRENATAL VITAMINS 28-0.8 MG PO TABS: 1.0000 | ORAL_TABLET | Freq: Every day | ORAL | 0 refills | Status: DC

## 2020-06-07 NOTE — Progress Notes (Signed)
Presents for MH at 26.1 weeks. Takes PNV daily. PNV dispensed today. Denies ED/Hospital visit since last RV. PHQ9 and CCNC forms completed. Preterm Labor counseled and given handout with HD and [facility] phone numbers. Sharlyne Pacas, RN

## 2020-06-07 NOTE — Progress Notes (Signed)
   PRENATAL VISIT NOTE  Subjective:  Jordan Mccarthy is a 34 y.o. G3P2002 at [redacted]w[redacted]d being seen today for ongoing prenatal care.  She is currently monitored for the following issues for this low-risk pregnancy and has Overweight BMI=27.3; Encounter for supervision of normal pregnancy in multigravida in first trimester; and Dental caries on their problem list.  Patient reports no complaints.  Contractions: Not present. Vag. Bleeding: None.  Movement: Present. Denies leaking of fluid/ROM.   The following portions of the patient's history were reviewed and updated as appropriate: allergies, current medications, past family history, past medical history, past social history, past surgical history and problem list. Problem list updated.  Objective:   Vitals:   06/07/20 0823  BP: (!) 96/59  Pulse: 74  Temp: (!) 97.1 F (36.2 C)  Weight: 171 lb 6.4 oz (77.7 kg)    Fetal Status: Fetal Heart Rate (bpm): 145 Fundal Height: 26 cm Movement: Present     General:  Alert, oriented and cooperative. Patient is in no acute distress.  Skin: Skin is warm and dry. No rash noted.   Cardiovascular: Normal heart rate noted  Respiratory: Normal respiratory effort, no problems with respiration noted  Abdomen: Soft, gravid, appropriate for gestational age.  Pain/Pressure: Absent     Pelvic: Cervical exam deferred        Extremities: Normal range of motion.  Edema: None  Mental Status: Normal mood and affect. Normal behavior. Normal judgment and thought content.   Assessment and Plan:  Pregnancy: G3P2002 at [redacted]w[redacted]d  1. Encounter for supervision of normal pregnancy in multigravida in first trimester Discuss with patient about concern for anemia.  Patient is taking PNV as directed.  Will draw Hbg at 28 week lab appt.    Discussed with patient about walking for exercise to help reduce dyspnea and to change pants, patient wear mostly compressive type of leggings, discussed with patient to change type of pants to  not compress tightly on belly.  Patient verbalized understanding.    2. Dental caries Patient had appointment scheduled for 07/07/2020 for wisdom teeth extraction.  Discussed with patient to contact office to inform of pregnancy and gestation at time of appointment.  Letter of approval of procedure given to patient.    3. Overweight BMI=27.3 Discussed diet and walking daily to help maintain a healthy weight.     Preterm labor symptoms and general obstetric precautions including but not limited to vaginal bleeding, contractions, leaking of fluid and fetal movement were reviewed in detail with the patient. Please refer to After Visit Summary for other counseling recommendations.  Return in about 2 weeks (around 06/21/2020) for routine prenatal care, 28 week labs.  Future Appointments  Date Time Provider Department Center  06/21/2020  8:40 AM [facility] PROVIDER [facility] None   interpreter used - V. Romona Curls, FNP

## 2020-06-21 ENCOUNTER — Other Ambulatory Visit: Payer: Self-pay

## 2020-06-21 ENCOUNTER — Ambulatory Visit: Payer: Self-pay | Admitting: Advanced Practice Midwife

## 2020-06-21 VITALS — BP 105/68 | HR 80 | Temp 97.4°F | Wt 171.4 lb

## 2020-06-21 DIAGNOSIS — Z3481 Encounter for supervision of other normal pregnancy, first trimester: Secondary | ICD-10-CM

## 2020-06-21 DIAGNOSIS — E663 Overweight: Secondary | ICD-10-CM

## 2020-06-21 DIAGNOSIS — K029 Dental caries, unspecified: Secondary | ICD-10-CM

## 2020-06-21 DIAGNOSIS — O99013 Anemia complicating pregnancy, third trimester: Secondary | ICD-10-CM

## 2020-06-21 DIAGNOSIS — O99019 Anemia complicating pregnancy, unspecified trimester: Secondary | ICD-10-CM | POA: Insufficient documentation

## 2020-06-21 LAB — HEMOGLOBIN, FINGERSTICK: Hemoglobin: 9.9 g/dL — ABNORMAL LOW (ref 11.1–15.9)

## 2020-06-21 MED ORDER — IRON (FERROUS SULFATE) 325 (65 FE) MG PO TABS
1.0000 | ORAL_TABLET | Freq: Two times a day (BID) | ORAL | 0 refills | Status: DC
Start: 2020-06-21 — End: 2020-07-20

## 2020-06-21 NOTE — Progress Notes (Signed)
Pt denies visits to ER since last appt at [facility]. Is taking PNV.  Pt accepts 28 weeks labs and Tdap vaccine today. Pt states she lost her dr note for permissions to get dental care; is requesting a new note.

## 2020-06-21 NOTE — Progress Notes (Signed)
Dental communication form completed by provider was faxed to [facility]; confirmation received. Hgb of 9.9 reviewed; provider notified, anemia profile added to labs, anemia added to problem list, pt denies pica, dispensed Ferrous Sulfate 325 mg take 1 tablet PO BID with juice, provided and discussed iron rich foods pamphlet, no referral to nutritionist. Provider orders completed.

## 2020-06-21 NOTE — Progress Notes (Signed)
   PRENATAL VISIT NOTE  Subjective:  Jordan Mccarthy is a 34 y.o. G3P2002 at [redacted]w[redacted]d being seen today for ongoing prenatal care.  She is currently monitored for the following issues for this low-risk pregnancy and has Overweight BMI=27.3; Encounter for supervision of normal pregnancy in multigravida in first trimester; and Dental caries on their problem list.  Patient reports no complaints.  Contractions: Not present. Vag. Bleeding: None.  Movement: Present. Denies leaking of fluid/ROM.   The following portions of the patient's history were reviewed and updated as appropriate: allergies, current medications, past family history, past medical history, past social history, past surgical history and problem list. Problem list updated.  Objective:   Vitals:   06/21/20 0832  BP: 105/68  Pulse: 80  Temp: (!) 97.4 F (36.3 C)  Weight: 171 lb 6.4 oz (77.7 kg)    Fetal Status: Fetal Heart Rate (bpm): 140 Fundal Height: 27 cm Movement: Present     General:  Alert, oriented and cooperative. Patient is in no acute distress.  Skin: Skin is warm and dry. No rash noted.   Cardiovascular: Normal heart rate noted  Respiratory: Normal respiratory effort, no problems with respiration noted  Abdomen: Soft, gravid, appropriate for gestational age.  Pain/Pressure: Absent     Pelvic: Cervical exam deferred        Extremities: Normal range of motion.  Edema: None  Mental Status: Normal mood and affect. Normal behavior. Normal judgment and thought content.   Assessment and Plan:  Pregnancy: G3P2002 at [redacted]w[redacted]d  1. Encounter for supervision of normal pregnancy in multigravida in first trimester 1 hour glucola today Not working.  Quad screen neg.  Feels well - HIV Antibody (routine testing w rflx) - RPR - Hemoglobin, venipuncture - Glucose, 1 hour gestational  2. Dental caries Has appt in December for a filling Has appt 07/07/20 to have 2 wisdom teeth removed--pt states she lost paperwork for  approval--another faxed by RN to surgeon today  3. Overweight BMI=27.3 Pt states she is not taking ASA 81 mg because didn't know anything about it   Preterm labor symptoms and general obstetric precautions including but not limited to vaginal bleeding, contractions, leaking of fluid and fetal movement were reviewed in detail with the patient. Please refer to After Visit Summary for other counseling recommendations.  No follow-ups on file.  No future appointments.  Alberteen Spindle, CNM

## 2020-06-22 LAB — FE+CBC/D/PLT+TIBC+FER+RETIC
Basophils Absolute: 0 10*3/uL (ref 0.0–0.2)
Basos: 0 %
EOS (ABSOLUTE): 0.1 10*3/uL (ref 0.0–0.4)
Eos: 1 %
Ferritin: 10 ng/mL — ABNORMAL LOW (ref 15–150)
Hematocrit: 30.7 % — ABNORMAL LOW (ref 34.0–46.6)
Hemoglobin: 10 g/dL — ABNORMAL LOW (ref 11.1–15.9)
Immature Grans (Abs): 0 10*3/uL (ref 0.0–0.1)
Immature Granulocytes: 0 %
Iron Saturation: 7 % — CL (ref 15–55)
Iron: 36 ug/dL (ref 27–159)
Lymphocytes Absolute: 1.6 10*3/uL (ref 0.7–3.1)
Lymphs: 20 %
MCH: 28.8 pg (ref 26.6–33.0)
MCHC: 32.6 g/dL (ref 31.5–35.7)
MCV: 89 fL (ref 79–97)
Monocytes Absolute: 0.3 10*3/uL (ref 0.1–0.9)
Monocytes: 4 %
Neutrophils Absolute: 6.3 10*3/uL (ref 1.4–7.0)
Neutrophils: 75 %
Platelets: 365 10*3/uL (ref 150–450)
RBC: 3.47 x10E6/uL — ABNORMAL LOW (ref 3.77–5.28)
RDW: 12.8 % (ref 11.7–15.4)
Retic Ct Pct: 1.7 % (ref 0.6–2.6)
Total Iron Binding Capacity: 495 ug/dL — ABNORMAL HIGH (ref 250–450)
UIBC: 459 ug/dL — ABNORMAL HIGH (ref 131–425)
WBC: 8.4 10*3/uL (ref 3.4–10.8)

## 2020-06-22 LAB — GLUCOSE, 1 HOUR GESTATIONAL: Gestational Diabetes Screen: 89 mg/dL (ref 65–139)

## 2020-06-22 LAB — RPR: RPR Ser Ql: NONREACTIVE

## 2020-06-22 LAB — HIV ANTIBODY (ROUTINE TESTING W REFLEX): HIV Screen 4th Generation wRfx: NONREACTIVE

## 2020-07-05 ENCOUNTER — Other Ambulatory Visit: Payer: Self-pay

## 2020-07-05 ENCOUNTER — Ambulatory Visit: Payer: Self-pay | Admitting: Family Medicine

## 2020-07-05 DIAGNOSIS — Z3483 Encounter for supervision of other normal pregnancy, third trimester: Secondary | ICD-10-CM

## 2020-07-05 DIAGNOSIS — Z3481 Encounter for supervision of other normal pregnancy, first trimester: Secondary | ICD-10-CM

## 2020-07-05 NOTE — Progress Notes (Signed)
   PRENATAL VISIT NOTE  Subjective:  Jordan Mccarthy is a 34 y.o. G3P2002 at [redacted]w[redacted]d being seen today for ongoing prenatal care.  She is currently monitored for the following issues for this low-risk pregnancy and has Overweight BMI=27.3; Encounter for supervision of normal pregnancy in multigravida in first trimester; Dental caries; and Anemia affecting pregnancy on their problem list.  Patient reports headache.  Contractions: Not present. Vag. Bleeding: None.  Movement: Present. Denies leaking of fluid/ROM.   The following portions of the patient's history were reviewed and updated as appropriate: allergies, current medications, past family history, past medical history, past social history, past surgical history and problem list. Problem list updated.  Objective:   Vitals:   07/05/20 0939  BP: 102/63  Pulse: 86  Temp: (!) 97.1 F (36.2 C)  Weight: 173 lb (78.5 kg)    Fetal Status: Fetal Heart Rate (bpm): 152 Fundal Height: 30 cm Movement: Present  Presentation: Transverse  General:  Alert, oriented and cooperative. Patient is in no acute distress.  Skin: Skin is warm and dry. No rash noted.   Cardiovascular: Normal heart rate noted  Respiratory: Normal respiratory effort, no problems with respiration noted  Abdomen: Soft, gravid, appropriate for gestational age.  Pain/Pressure: Absent     Pelvic: Cervical exam deferred        Extremities: Normal range of motion.  Edema: None  Mental Status: Normal mood and affect. Normal behavior. Normal judgment and thought content.   Assessment and Plan:  Pregnancy: G3P2002 at [redacted]w[redacted]d  1. Encounter for supervision of normal pregnancy in multigravida in first trimester HA  Since Friday denies aura, gets better with sleep.  Encouraged patient to take tylenol.      Preterm labor symptoms and general obstetric precautions including but not limited to vaginal bleeding, contractions, leaking of fluid and fetal movement were reviewed in detail  with the patient. Please refer to After Visit Summary for other counseling recommendations.  Return in about 2 weeks (around 07/19/2020) for routine prenatal care.  Future Appointments  Date Time Provider Department Center  07/20/2020 10:20 AM [facility] PROVIDER [facility] None   Spanish interpreter Elsie Ra, FNP

## 2020-07-05 NOTE — Progress Notes (Signed)
Correctly verbalizes how to take PNV and BID iron. Taking iron tablet with orange juice. Jossie Ng, RN

## 2020-07-20 ENCOUNTER — Other Ambulatory Visit: Payer: Self-pay

## 2020-07-20 ENCOUNTER — Ambulatory Visit: Payer: Self-pay | Admitting: Family Medicine

## 2020-07-20 VITALS — BP 112/63 | HR 86 | Temp 97.7°F | Wt 175.2 lb

## 2020-07-20 DIAGNOSIS — O99012 Anemia complicating pregnancy, second trimester: Secondary | ICD-10-CM

## 2020-07-20 DIAGNOSIS — Z3490 Encounter for supervision of normal pregnancy, unspecified, unspecified trimester: Secondary | ICD-10-CM

## 2020-07-20 LAB — HEMOGLOBIN, FINGERSTICK: Hemoglobin: 10.1 g/dL — ABNORMAL LOW (ref 11.1–15.9)

## 2020-07-20 MED ORDER — PRENATAL VITAMIN 27-0.8 MG PO TABS: 1.0000 | ORAL_TABLET | Freq: Every day | ORAL | 0 refills | Status: DC

## 2020-07-20 MED ORDER — FERROUS SULFATE 325 (65 FE) MG PO TABS: 325.0000 mg | ORAL_TABLET | Freq: Two times a day (BID) | ORAL | 0 refills | Status: DC

## 2020-07-20 MED ORDER — FERROUS SULFATE 325 (65 FE) MG PO TABS: 325.0000 mg | ORAL_TABLET | Freq: Two times a day (BID) | ORAL | Status: DC

## 2020-07-20 NOTE — Progress Notes (Signed)
   PRENATAL VISIT NOTE  Subjective:  Jordan Mccarthy is a 34 y.o. G3P2002 at [redacted]w[redacted]d being seen today for ongoing prenatal care.  She is currently monitored for the following issues for this low-risk pregnancy and has Overweight BMI=27.3; Encounter for supervision of normal pregnancy in multigravida in first trimester; Dental caries; and Anemia affecting pregnancy on their problem list.  Patient reports no complaints.  Contractions: Not present. Vag. Bleeding: None.  Movement: Present. Denies leaking of fluid/ROM.   The following portions of the patient's history were reviewed and updated as appropriate: allergies, current medications, past family history, past medical history, past social history, past surgical history and problem list. Problem list updated.  Objective:   Vitals:   07/20/20 0956  BP: 112/63  Pulse: 86  Temp: 97.7 F (36.5 C)  Weight: 175 lb 3.2 oz (79.5 kg)    Fetal Status: Fetal Heart Rate (bpm): 153 Fundal Height: 33 cm Movement: Present  Presentation: Transverse  General:  Alert, oriented and cooperative. Patient is in no acute distress.  Skin: Skin is warm and dry. No rash noted.   Cardiovascular: Normal heart rate noted  Respiratory: Normal respiratory effort, no problems with respiration noted  Abdomen: Soft, gravid, appropriate for gestational age.  Pain/Pressure: Absent     Pelvic: Cervical exam deferred        Extremities: Normal range of motion.  Edema: None  Mental Status: Normal mood and affect. Normal behavior. Normal judgment and thought content.   Assessment and Plan:  Pregnancy: G3P2002 at [redacted]w[redacted]d  1. Encounter for supervision of normal pregnancy, antepartum, unspecified gravidity Followed up on HA, last HA was 2 weeks ago,  Patient increase water intake.  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  Is still arranging sleeping for baby    patient plans for paraguard  postpartum.    2. Anemia during pregnancy in second trimester  10.1 today encouraged to continue with PNV and FE tabs.   - Hemoglobin, venipuncture   Preterm labor symptoms and general obstetric precautions including but not limited to vaginal bleeding, contractions, leaking of fluid and fetal movement were reviewed in detail with the patient. Please refer to After Visit Summary for other counseling recommendations.  Return in about 2 weeks (around 08/03/2020) for routine prenatal care.  Future Appointments  Date Time Provider Department Center  08/03/2020 10:00 AM [facility] PROVIDER [facility] None   Spanish interpreter used: Marlene Yemen  Wendi Snipes, FNP

## 2020-07-20 NOTE — Progress Notes (Addendum)
Correctly verbalizes how to take PNV with BID iron tablet (takes with orange juice). Hgb today. Jossie Ng, RN Hgb = 10.2 (up from 9.9) and client to continue BID iron per Elveria Rising FNP. Jossie Ng, RN

## 2020-07-20 NOTE — Addendum Note (Signed)
Addended by: Jossie Ng on: 07/20/2020 11:04 AM   Modules accepted: Orders

## 2020-07-24 NOTE — L&D Delivery Note (Signed)
Date of delivery: 09/13/20 Estimated Date of Delivery: 09/12/20 Patient's last menstrual period was 12/07/2019 (approximate). EGA: [redacted]w[redacted]d  Delivery Note At 6:26 PM a viable female was delivered via Vaginal, Spontaneous (Presentation: Left Occiput Anterior).  APGAR: 8, 9; weight  .   Placenta status: Spontaneous, Intact.  Cord: 3 vessels with the following complications: None.    Anesthesia: Epidural Episiotomy: None Lacerations: None Suture Repair: n/a Est. Blood Loss (mL): 125  Mom to postpartum.  Baby to Couplet care / Skin to Skin.  Jordan Mccarthy 09/13/2020, 6:42 PM

## 2020-08-03 ENCOUNTER — Other Ambulatory Visit: Payer: Self-pay

## 2020-08-03 ENCOUNTER — Ambulatory Visit: Payer: Self-pay | Admitting: Advanced Practice Midwife

## 2020-08-03 DIAGNOSIS — Z3481 Encounter for supervision of other normal pregnancy, first trimester: Secondary | ICD-10-CM

## 2020-08-03 DIAGNOSIS — K029 Dental caries, unspecified: Secondary | ICD-10-CM

## 2020-08-03 NOTE — Progress Notes (Signed)
Correctly verbalizes how to take iron tablet and PNV. Takes iron with orange juice. Considering Covid booster, but declines today. Jossie Ng, RN

## 2020-08-03 NOTE — Progress Notes (Signed)
   PRENATAL VISIT NOTE  Subjective:  Jordan Mccarthy is a 35 y.o. G3P2002 at [redacted]w[redacted]d being seen today for ongoing prenatal care.  She is currently monitored for the following issues for this low-risk pregnancy and has Overweight BMI=27.3; Encounter for supervision of normal pregnancy in multigravida in first trimester; Dental caries; and Anemia affecting pregnancy on their problem list.  Patient reports continued left lower quadrant cramping since 07/31/20.  Contractions: Irritability. Vag. Bleeding: None.  Movement: Present. Denies leaking of fluid/ROM.   The following portions of the patient's history were reviewed and updated as appropriate: allergies, current medications, past family history, past medical history, past social history, past surgical history and problem list. Problem list updated.  Objective:  There were no vitals filed for this visit.  Fetal Status: Fetal Heart Rate (bpm): 130 Fundal Height: 34 cm Movement: Present     General:  Alert, oriented and cooperative. Patient is in no acute distress.  Skin: Skin is warm and dry. No rash noted.   Cardiovascular: Normal heart rate noted  Respiratory: Normal respiratory effort, no problems with respiration noted  Abdomen: Soft, gravid, appropriate for gestational age.  Pain/Pressure: Absent     Pelvic: Cervical exam deferred        Extremities: Normal range of motion.  Edema: None  Mental Status: Normal mood and affect. Normal behavior. Normal judgment and thought content.   Assessment and Plan:  Pregnancy: G3P2002 at [redacted]w[redacted]d  1. Encounter for supervision of normal pregnancy in multigravida in first trimester Pt states began lower abdomen/low back cramping/contractions intermittent x 18 hours. Went to [facility] L&D 08/01/20, reactive NST, irregular u/c's, C&S neg, wet mount and cultures neg, th/1/-4 and d/c home.  C/o continued LLQ cramping since then.  Probably position of fetus as no sxs UTI or u/c's.  Suggestions given for comfort. Not  working.   Taking FeS04 BID with oj seperately from vits Plans ocp's pp for birth control.   Saw dentist and had 3 cavities filled   Preterm labor symptoms and general obstetric precautions including but not limited to vaginal bleeding, contractions, leaking of fluid and fetal movement were reviewed in detail with the patient. Please refer to After Visit Summary for other counseling recommendations.  Return in about 2 weeks (around 08/17/2020).  No future appointments.  Alberteen Spindle, CNM

## 2020-08-17 ENCOUNTER — Ambulatory Visit: Payer: Self-pay

## 2020-08-18 ENCOUNTER — Ambulatory Visit: Payer: Self-pay | Admitting: Advanced Practice Midwife

## 2020-08-18 ENCOUNTER — Other Ambulatory Visit: Payer: Self-pay

## 2020-08-18 VITALS — BP 99/64 | HR 66 | Temp 97.6°F | Wt 181.4 lb

## 2020-08-18 DIAGNOSIS — Z3481 Encounter for supervision of other normal pregnancy, first trimester: Secondary | ICD-10-CM

## 2020-08-18 DIAGNOSIS — O99013 Anemia complicating pregnancy, third trimester: Secondary | ICD-10-CM

## 2020-08-18 DIAGNOSIS — E663 Overweight: Secondary | ICD-10-CM

## 2020-08-18 LAB — HEMOGLOBIN, FINGERSTICK: Hemoglobin: 10.5 g/dL — ABNORMAL LOW (ref 11.1–15.9)

## 2020-08-18 MED ORDER — PRENATAL VITAMINS 28-0.8 MG PO TABS: 1.0000 | ORAL_TABLET | Freq: Every day | ORAL | 0 refills | Status: DC

## 2020-08-18 MED ORDER — IRON (FERROUS SULFATE) 325 (65 FE) MG PO TABS: 1.0000 | ORAL_TABLET | Freq: Two times a day (BID) | ORAL | 0 refills | Status: DC

## 2020-08-18 NOTE — Progress Notes (Signed)
   PRENATAL VISIT NOTE  Subjective:  Jordan Mccarthy is a 35 y.o. G3P2002 at [redacted]w[redacted]d being seen today for ongoing prenatal care.  She is currently monitored for the following issues for this low-risk pregnancy and has Overweight BMI=27.3; Encounter for supervision of normal pregnancy in multigravida in first trimester; Dental caries; and Anemia affecting pregnancy on their problem list.  Patient reports no complaints.  Contractions: Not present. Vag. Bleeding: None.  Movement: Present. Denies leaking of fluid/ROM.   The following portions of the patient's history were reviewed and updated as appropriate: allergies, current medications, past family history, past medical history, past social history, past surgical history and problem list. Problem list updated.  Objective:   Vitals:   08/18/20 1030  BP: 99/64  Pulse: 66  Temp: 97.6 F (36.4 C)  Weight: 181 lb 6.4 oz (82.3 kg)    Fetal Status: Fetal Heart Rate (bpm): 140 Fundal Height: 36 cm Movement: Present  Presentation: Vertex  General:  Alert, oriented and cooperative. Patient is in no acute distress.  Skin: Skin is warm and dry. No rash noted.   Cardiovascular: Normal heart rate noted  Respiratory: Normal respiratory effort, no problems with respiration noted  Abdomen: Soft, gravid, appropriate for gestational age.  Pain/Pressure: Absent     Pelvic: Cervical exam deferred        Extremities: Normal range of motion.  Edema: None  Mental Status: Normal mood and affect. Normal behavior. Normal judgment and thought content.   Assessment and Plan:  Pregnancy: G3P2002 at [redacted]w[redacted]d  1. Overweight BMI=27.3 24 lb 6.4 oz (11.1 kg) Not taking ASA 81 mg 6 lb wt gain in last 2 wks  2. Anemia affecting pregnancy in third trimester Taking FeSo4 BID with oj Hgb today  3. Encounter for supervision of normal pregnancy in multigravida in first trimester No car seat yet--asking for someone to give her one; referred to Elease Hashimoto with [employer] Knows when to go to L&D GC/Chlamydia/GBS cultures done - Hemoglobin, venipuncture - GBS Culture - Chlamydia/GC NAA, Confirmation - Prenatal Vit-Fe Fumarate-FA (PRENATAL VITAMINS) 28-0.8 MG TABS; Take 1 tablet by mouth daily.  Dispense: 100 tablet; Refill: 0 - Iron, Ferrous Sulfate, 325 (65 Fe) MG TABS; Take 1 tablet by mouth 2 (two) times daily.  Dispense: 100 tablet; Refill: 0   Preterm labor symptoms and general obstetric precautions including but not limited to vaginal bleeding, contractions, leaking of fluid and fetal movement were reviewed in detail with the patient. Please refer to After Visit Summary for other counseling recommendations.  No follow-ups on file.  No future appointments.  Alberteen Spindle, CNM

## 2020-08-18 NOTE — Progress Notes (Signed)
Hgb 10.5. Instructed to continue taking Iron BID with vitamin C source. Recheck Hgb in one month. Tawny Hopping, RN

## 2020-08-18 NOTE — Progress Notes (Addendum)
Here today for 36.3 week MH RV. Taking PNV QD and Iron BID. Additional PNV and Iron requested and given. Denies ED/hospital visits since last RV. 36 week labs and packet today. Tawny Hopping, RN

## 2020-08-20 ENCOUNTER — Telehealth: Payer: Self-pay | Admitting: Nurse Practitioner

## 2020-08-20 LAB — CHLAMYDIA/GC NAA, CONFIRMATION
Chlamydia trachomatis, NAA: NEGATIVE
Neisseria gonorrhoeae, NAA: NEGATIVE

## 2020-08-20 NOTE — Telephone Encounter (Signed)
Pt states she has had the covid vaccine.

## 2020-08-22 LAB — CULTURE, BETA STREP (GROUP B ONLY): Strep Gp B Culture: NEGATIVE

## 2020-08-25 ENCOUNTER — Other Ambulatory Visit: Payer: Self-pay

## 2020-08-25 ENCOUNTER — Ambulatory Visit: Payer: Self-pay | Admitting: Advanced Practice Midwife

## 2020-08-25 VITALS — BP 107/63 | HR 73 | Temp 97.9°F | Wt 182.2 lb

## 2020-08-25 DIAGNOSIS — O99013 Anemia complicating pregnancy, third trimester: Secondary | ICD-10-CM

## 2020-08-25 DIAGNOSIS — E663 Overweight: Secondary | ICD-10-CM

## 2020-08-25 DIAGNOSIS — Z3483 Encounter for supervision of other normal pregnancy, third trimester: Secondary | ICD-10-CM

## 2020-08-25 DIAGNOSIS — Z3481 Encounter for supervision of other normal pregnancy, first trimester: Secondary | ICD-10-CM

## 2020-08-25 NOTE — Progress Notes (Signed)
Here today for 37.3 week MH RV. Taking PNV and Iron QD. Denies ED/hospital visits since last RV. Tawny Hopping, RN

## 2020-08-25 NOTE — Progress Notes (Signed)
   PRENATAL VISIT NOTE  Subjective:  Jordan Mccarthy is a 35 y.o. G3P2002 at [redacted]w[redacted]d being seen today for ongoing prenatal care.  She is currently monitored for the following issues for this low-risk pregnancy and has Overweight BMI=27.3; Encounter for supervision of normal pregnancy in multigravida in first trimester; Dental caries; and Anemia affecting pregnancy on their problem list.  Patient reports no complaints.  Contractions: Not present. Vag. Bleeding: None.  Movement: Present. Denies leaking of fluid/ROM.   The following portions of the patient's history were reviewed and updated as appropriate: allergies, current medications, past family history, past medical history, past social history, past surgical history and problem list. Problem list updated.  Objective:   Vitals:   08/25/20 0955  BP: 107/63  Pulse: 73  Temp: 97.9 F (36.6 C)  Weight: 182 lb 3.2 oz (82.6 kg)    Fetal Status: Fetal Heart Rate (bpm): 150 Fundal Height: 37 cm Movement: Present  Presentation: Vertex  General:  Alert, oriented and cooperative. Patient is in no acute distress.  Skin: Skin is warm and dry. No rash noted.   Cardiovascular: Normal heart rate noted  Respiratory: Normal respiratory effort, no problems with respiration noted  Abdomen: Soft, gravid, appropriate for gestational age.  Pain/Pressure: Absent     Pelvic: Cervical exam deferred        Extremities: Normal range of motion.  Edema: None  Mental Status: Normal mood and affect. Normal behavior. Normal judgment and thought content.   Assessment and Plan:  Pregnancy: G3P2002 at [redacted]w[redacted]d  1. Overweight BMI=27.3 25 lb 3.2 oz (11.4 kg)   2. Anemia affecting pregnancy in third trimester Taking FeSo4 BID with oj seperately from vits  3. Encounter for supervision of normal pregnancy in multigravida in first trimester Has car seat and ready for baby.  Knows when to go to L&D   Preterm labor symptoms and general obstetric precautions  including but not limited to vaginal bleeding, contractions, leaking of fluid and fetal movement were reviewed in detail with the patient. Please refer to After Visit Summary for other counseling recommendations.  Return in about 1 week (around 09/01/2020) for routine PNC.  Future Appointments  Date Time Provider Department Center  09/02/2020  8:40 AM [facility] PROVIDER [facility] None    Alberteen Spindle, CNM

## 2020-09-02 ENCOUNTER — Ambulatory Visit: Payer: Self-pay

## 2020-09-03 ENCOUNTER — Ambulatory Visit: Payer: Self-pay | Admitting: Advanced Practice Midwife

## 2020-09-03 ENCOUNTER — Other Ambulatory Visit: Payer: Self-pay

## 2020-09-03 VITALS — BP 103/67 | HR 70 | Temp 97.5°F | Wt 185.2 lb

## 2020-09-03 DIAGNOSIS — Z3481 Encounter for supervision of other normal pregnancy, first trimester: Secondary | ICD-10-CM

## 2020-09-03 DIAGNOSIS — E663 Overweight: Secondary | ICD-10-CM

## 2020-09-03 DIAGNOSIS — O99013 Anemia complicating pregnancy, third trimester: Secondary | ICD-10-CM

## 2020-09-03 DIAGNOSIS — Z3483 Encounter for supervision of other normal pregnancy, third trimester: Secondary | ICD-10-CM

## 2020-09-03 NOTE — Progress Notes (Signed)
Patient here for MH RV at 38 5/7. Patient states she is not interested in Covid booster at this time. Patient counseled on importance of booster shot.Burt Knack, RN

## 2020-09-03 NOTE — Progress Notes (Signed)
Patient has decided to have covid booster today after maternity appointment.Burt Knack, RN

## 2020-09-03 NOTE — Progress Notes (Signed)
   PRENATAL VISIT NOTE  Subjective:  Jordan Mccarthy is a 35 y.o. G3P2002 at 109w5d being seen today for ongoing prenatal care.  She is currently monitored for the following issues for this low-risk pregnancy and has Overweight BMI=27.3; Encounter for supervision of normal pregnancy in multigravida in first trimester; Dental caries; and Anemia affecting pregnancy on their problem list.  Patient reports no complaints.  Contractions: Not present. Vag. Bleeding: None.  Movement: Present. Denies leaking of fluid/ROM.   The following portions of the patient's history were reviewed and updated as appropriate: allergies, current medications, past family history, past medical history, past social history, past surgical history and problem list. Problem list updated.  Objective:   Vitals:   09/03/20 0900  BP: 103/67  Pulse: 70  Temp: (!) 97.5 F (36.4 C)  Weight: 185 lb 3.2 oz (84 kg)    Fetal Status: Fetal Heart Rate (bpm): 130 Fundal Height: 38 cm Movement: Present  Presentation: Vertex  General:  Alert, oriented and cooperative. Patient is in no acute distress.  Skin: Skin is warm and dry. No rash noted.   Cardiovascular: Normal heart rate noted  Respiratory: Normal respiratory effort, no problems with respiration noted  Abdomen: Soft, gravid, appropriate for gestational age.  Pain/Pressure: Absent     Pelvic: Cervical exam deferred        Extremities: Normal range of motion.  Edema: None  Mental Status: Normal mood and affect. Normal behavior. Normal judgment and thought content.   Assessment and Plan:  Pregnancy: G3P2002 at [redacted]w[redacted]d  1. Encounter for supervision of normal pregnancy in multigravida in first trimester Feels well. Has car seat and ready for baby at home.  Knows when to go to L&D  2. Overweight BMI=27.3 28 lb 3.2 oz (12.8 kg)   3. Anemia affecting pregnancy in third trimester Taking FeSo4 BID with juice   Term labor symptoms and general obstetric precautions  including but not limited to vaginal bleeding, contractions, leaking of fluid and fetal movement were reviewed in detail with the patient. Please refer to After Visit Summary for other counseling recommendations.  Return in about 1 week (around 09/10/2020) for routine PNC.  Future Appointments  Date Time Provider Department Center  09/10/2020  9:00 AM [facility] PROVIDER [facility] None    Alberteen Spindle, CNM

## 2020-09-10 ENCOUNTER — Ambulatory Visit: Payer: Self-pay | Admitting: Advanced Practice Midwife

## 2020-09-10 ENCOUNTER — Other Ambulatory Visit: Payer: Self-pay

## 2020-09-10 VITALS — BP 99/70 | HR 71 | Temp 98.1°F | Wt 186.2 lb

## 2020-09-10 DIAGNOSIS — Z3483 Encounter for supervision of other normal pregnancy, third trimester: Secondary | ICD-10-CM

## 2020-09-10 DIAGNOSIS — Z3481 Encounter for supervision of other normal pregnancy, first trimester: Secondary | ICD-10-CM

## 2020-09-10 DIAGNOSIS — E663 Overweight: Secondary | ICD-10-CM

## 2020-09-10 DIAGNOSIS — O99013 Anemia complicating pregnancy, third trimester: Secondary | ICD-10-CM

## 2020-09-10 NOTE — Progress Notes (Addendum)
Continues to take iron BID with orange juice and PNV daily. Correctly verbalizes how to take medicines and take iron with orange juice. Jossie Ng, RN  [facility] IOL referal faxed with snapshot pages and records. Fax confirmation received. Jossie Ng, RN

## 2020-09-10 NOTE — Progress Notes (Signed)
   PRENATAL VISIT NOTE  Subjective:  Jordan Mccarthy is a 35 y.o. G3P2002 at [redacted]w[redacted]d being seen today for ongoing prenatal care.  She is currently monitored for the following issues for this low-risk pregnancy and has Overweight BMI=27.3; Encounter for supervision of normal pregnancy in multigravida in first trimester; Dental caries; and Anemia affecting pregnancy on their problem list.  Patient reports fatigue.  Contractions: Not present. Vag. Bleeding: None.  Movement: Present. Denies leaking of fluid/ROM.   The following portions of the patient's history were reviewed and updated as appropriate: allergies, current medications, past family history, past medical history, past social history, past surgical history and problem list. Problem list updated.  Objective:   Vitals:   09/10/20 0905  BP: 99/70  Pulse: 71  Temp: 98.1 F (36.7 C)  Weight: 186 lb 3.2 oz (84.5 kg)    Fetal Status: Fetal Heart Rate (bpm): 140 Fundal Height: 37 cm Movement: Present  Presentation: Vertex  General:  Alert, oriented and cooperative. Patient is in no acute distress.  Skin: Skin is warm and dry. No rash noted.   Cardiovascular: Normal heart rate noted  Respiratory: Normal respiratory effort, no problems with respiration noted  Abdomen: Soft, gravid, appropriate for gestational age.  Pain/Pressure: Absent     Pelvic: Cervical exam deferred        Extremities: Normal range of motion.  Edema: None  Mental Status: Normal mood and affect. Normal behavior. Normal judgment and thought content.   Assessment and Plan:  Pregnancy: G3P2002 at [redacted]w[redacted]d  1. Overweight BMI=27.3 29 lb 3.2 oz (13.2 kg)   2. Anemia affecting pregnancy in third trimester Taking FeSo4 BID with oj To check Hgb next week  3. Encounter for supervision of normal pregnancy in multigravida in first trimester Has car seat.  Knows when to go to L&D IOL paperwork completed--pt desires 09/22/20.   Term labor symptoms and general obstetric  precautions including but not limited to vaginal bleeding, contractions, leaking of fluid and fetal movement were reviewed in detail with the patient. Please refer to After Visit Summary for other counseling recommendations.  No follow-ups on file.  No future appointments.  Alberteen Spindle, CNM

## 2020-09-13 ENCOUNTER — Inpatient Hospital Stay: Payer: Medicaid Other | Admitting: Anesthesiology

## 2020-09-13 ENCOUNTER — Inpatient Hospital Stay
Admission: EM | Admit: 2020-09-13 | Discharge: 2020-09-14 | DRG: 805 | Disposition: A | Discharge status: Home / Self Care | Payer: Medicaid Other | Attending: Obstetrics and Gynecology | Admitting: Obstetrics and Gynecology

## 2020-09-13 ENCOUNTER — Encounter: Payer: Self-pay | Admitting: Obstetrics & Gynecology

## 2020-09-13 ENCOUNTER — Other Ambulatory Visit: Payer: Self-pay

## 2020-09-13 DIAGNOSIS — E669 Obesity, unspecified: Secondary | ICD-10-CM | POA: Diagnosis present

## 2020-09-13 DIAGNOSIS — U071 COVID-19: Secondary | ICD-10-CM | POA: Diagnosis present

## 2020-09-13 DIAGNOSIS — O9852 Other viral diseases complicating childbirth: Secondary | ICD-10-CM | POA: Diagnosis present

## 2020-09-13 DIAGNOSIS — O9902 Anemia complicating childbirth: Principal | ICD-10-CM | POA: Diagnosis present

## 2020-09-13 DIAGNOSIS — Z3A40 40 weeks gestation of pregnancy: Secondary | ICD-10-CM | POA: Diagnosis not present

## 2020-09-13 DIAGNOSIS — D509 Iron deficiency anemia, unspecified: Secondary | ICD-10-CM | POA: Diagnosis present

## 2020-09-13 DIAGNOSIS — O99214 Obesity complicating childbirth: Secondary | ICD-10-CM | POA: Diagnosis present

## 2020-09-13 DIAGNOSIS — D62 Acute posthemorrhagic anemia: Secondary | ICD-10-CM | POA: Diagnosis not present

## 2020-09-13 DIAGNOSIS — O26893 Other specified pregnancy related conditions, third trimester: Secondary | ICD-10-CM | POA: Diagnosis present

## 2020-09-13 LAB — RESP PANEL BY RT-PCR (FLU A&B, COVID) ARPGX2
Influenza A by PCR: NEGATIVE
Influenza B by PCR: NEGATIVE
SARS Coronavirus 2 by RT PCR: POSITIVE — AB

## 2020-09-13 LAB — COMPREHENSIVE METABOLIC PANEL
ALT: 25 U/L (ref 0–44)
AST: 44 U/L — ABNORMAL HIGH (ref 15–41)
Albumin: 3.3 g/dL — ABNORMAL LOW (ref 3.5–5.0)
Alkaline Phosphatase: 143 U/L — ABNORMAL HIGH (ref 38–126)
Anion gap: 9 (ref 5–15)
BUN: 10 mg/dL (ref 6–20)
CO2: 22 mmol/L (ref 22–32)
Calcium: 9 mg/dL (ref 8.9–10.3)
Chloride: 105 mmol/L (ref 98–111)
Creatinine, Ser: 0.73 mg/dL (ref 0.44–1.00)
GFR, Estimated: >60 mL/min (ref >60)
Glucose, Bld: 97 mg/dL (ref 70–99)
Potassium: 3.8 mmol/L (ref 3.5–5.1)
Sodium: 136 mmol/L (ref 135–145)
Total Bilirubin: 0.5 mg/dL (ref 0.3–1.2)
Total Protein: 7.4 g/dL (ref 6.5–8.1)

## 2020-09-13 LAB — TYPE AND SCREEN
ABO/RH(D): A POS
Antibody Screen: NEGATIVE

## 2020-09-13 LAB — CBC
HCT: 32.2 % — ABNORMAL LOW (ref 36.0–46.0)
Hemoglobin: 10.8 g/dL — ABNORMAL LOW (ref 12.0–15.0)
MCH: 29.1 pg (ref 26.0–34.0)
MCHC: 33.5 g/dL (ref 30.0–36.0)
MCV: 86.8 fL (ref 80.0–100.0)
Platelets: 305 10*3/uL (ref 150–400)
RBC: 3.71 MIL/uL — ABNORMAL LOW (ref 3.87–5.11)
RDW: 14.6 % (ref 11.5–15.5)
WBC: 10.2 10*3/uL (ref 4.0–10.5)
nRBC: 0 % (ref 0.0–0.2)

## 2020-09-13 LAB — ABO/RH: ABO/RH(D): A POS

## 2020-09-13 LAB — PROTEIN / CREATININE RATIO, URINE
Creatinine, Urine: 214 mg/dL
Protein Creatinine Ratio: 0.05 mg/mg{Cre} (ref 0.00–0.15)
Total Protein, Urine: 10 mg/dL

## 2020-09-13 MED ORDER — ACETAMINOPHEN 325 MG PO TABS: 650.0000 mg | ORAL_TABLET | ORAL | Status: DC | PRN

## 2020-09-13 MED ORDER — OXYTOCIN BOLUS FROM INFUSION
333.0000 mL | Freq: Once | INTRAVENOUS | Status: AC
  Administered 2020-09-13: 333 mL via INTRAVENOUS

## 2020-09-13 MED ORDER — ZOLPIDEM TARTRATE 5 MG PO TABS: 5.0000 mg | ORAL_TABLET | Freq: Every evening | ORAL | Status: DC | PRN

## 2020-09-13 MED ORDER — PHENYLEPHRINE 40 MCG/ML (10ML) SYRINGE FOR IV PUSH (FOR BLOOD PRESSURE SUPPORT)
80.0000 ug | PREFILLED_SYRINGE | INTRAVENOUS | Status: DC | PRN
  Filled 2020-09-13: qty 10

## 2020-09-13 MED ORDER — LACTATED RINGERS IV SOLN
500.0000 mL | Freq: Once | INTRAVENOUS | Status: AC
  Administered 2020-09-13: 500 mL via INTRAVENOUS

## 2020-09-13 MED ORDER — OXYTOCIN-SODIUM CHLORIDE 30-0.9 UT/500ML-% IV SOLN: 2.5000 [IU]/h | INTRAVENOUS | Status: DC

## 2020-09-13 MED ORDER — SENNOSIDES-DOCUSATE SODIUM 8.6-50 MG PO TABS
2.0000 | ORAL_TABLET | Freq: Every day | ORAL | Status: DC
  Administered 2020-09-14: 2 via ORAL
  Filled 2020-09-13: qty 2

## 2020-09-13 MED ORDER — AMMONIA AROMATIC IN INHA
RESPIRATORY_TRACT | Status: AC
  Filled 2020-09-13: qty 10

## 2020-09-13 MED ORDER — PRENATAL MULTIVITAMIN CH
1.0000 | ORAL_TABLET | Freq: Every day | ORAL | Status: DC
  Administered 2020-09-14: 1 via ORAL
  Filled 2020-09-13: qty 1

## 2020-09-13 MED ORDER — OXYTOCIN 10 UNIT/ML IJ SOLN
INTRAMUSCULAR | Status: AC
  Filled 2020-09-13: qty 2

## 2020-09-13 MED ORDER — SIMETHICONE 80 MG PO CHEW: 80.0000 mg | CHEWABLE_TABLET | ORAL | Status: DC | PRN

## 2020-09-13 MED ORDER — MISOPROSTOL 200 MCG PO TABS
ORAL_TABLET | ORAL | Status: AC
  Filled 2020-09-13: qty 4

## 2020-09-13 MED ORDER — COCONUT OIL OIL: 1.0000 "application " | TOPICAL_OIL | Status: DC | PRN

## 2020-09-13 MED ORDER — EPHEDRINE 5 MG/ML INJ
10.0000 mg | INTRAVENOUS | Status: DC | PRN
  Filled 2020-09-13: qty 2

## 2020-09-13 MED ORDER — WITCH HAZEL-GLYCERIN EX PADS: 1.0000 "application " | MEDICATED_PAD | CUTANEOUS | Status: DC | PRN

## 2020-09-13 MED ORDER — ONDANSETRON HCL 4 MG/2ML IJ SOLN: 4.0000 mg | INTRAMUSCULAR | Status: DC | PRN

## 2020-09-13 MED ORDER — LACTATED RINGERS IV SOLN: 500.0000 mL | INTRAVENOUS | Status: DC | PRN

## 2020-09-13 MED ORDER — BENZOCAINE-MENTHOL 20-0.5 % EX AERO: 1.0000 "application " | INHALATION_SPRAY | CUTANEOUS | Status: DC | PRN

## 2020-09-13 MED ORDER — ONDANSETRON HCL 4 MG PO TABS: 4.0000 mg | ORAL_TABLET | ORAL | Status: DC | PRN

## 2020-09-13 MED ORDER — DIBUCAINE (PERIANAL) 1 % EX OINT: 1.0000 "application " | TOPICAL_OINTMENT | CUTANEOUS | Status: DC | PRN

## 2020-09-13 MED ORDER — DIPHENHYDRAMINE HCL 50 MG/ML IJ SOLN: 12.5000 mg | INTRAMUSCULAR | Status: DC | PRN

## 2020-09-13 MED ORDER — FENTANYL 2.5 MCG/ML W/ROPIVACAINE 0.15% IN NS 100 ML EPIDURAL (ARMC)
EPIDURAL | Status: AC
  Filled 2020-09-13: qty 100

## 2020-09-13 MED ORDER — LIDOCAINE-EPINEPHRINE (PF) 1.5 %-1:200000 IJ SOLN
INTRAMUSCULAR | Status: DC | PRN
  Administered 2020-09-13: 3 mL via EPIDURAL

## 2020-09-13 MED ORDER — OXYTOCIN-SODIUM CHLORIDE 30-0.9 UT/500ML-% IV SOLN
INTRAVENOUS | Status: AC
  Filled 2020-09-13: qty 1000

## 2020-09-13 MED ORDER — FENTANYL 2.5 MCG/ML W/ROPIVACAINE 0.15% IN NS 100 ML EPIDURAL (ARMC)
12.0000 mL/h | EPIDURAL | Status: DC
  Administered 2020-09-13: 12 mL/h via EPIDURAL

## 2020-09-13 MED ORDER — FENTANYL CITRATE (PF) 100 MCG/2ML IJ SOLN: 50.0000 ug | INTRAMUSCULAR | Status: DC | PRN

## 2020-09-13 MED ORDER — IBUPROFEN 600 MG PO TABS: 600.0000 mg | ORAL_TABLET | Freq: Four times a day (QID) | ORAL | Status: DC

## 2020-09-13 MED ORDER — LIDOCAINE HCL (PF) 1 % IJ SOLN
INTRAMUSCULAR | Status: DC | PRN
  Administered 2020-09-13: 3 mL via SUBCUTANEOUS

## 2020-09-13 MED ORDER — LACTATED RINGERS IV SOLN: INTRAVENOUS | Status: DC

## 2020-09-13 MED ORDER — BUPIVACAINE HCL (PF) 0.25 % IJ SOLN
INTRAMUSCULAR | Status: DC | PRN
  Administered 2020-09-13 (×2): 4 mL via EPIDURAL

## 2020-09-13 MED ORDER — SOD CITRATE-CITRIC ACID 500-334 MG/5ML PO SOLN: 30.0000 mL | ORAL | Status: DC | PRN

## 2020-09-13 MED ORDER — DIPHENHYDRAMINE HCL 25 MG PO CAPS: 25.0000 mg | ORAL_CAPSULE | Freq: Four times a day (QID) | ORAL | Status: DC | PRN

## 2020-09-13 MED ORDER — LIDOCAINE HCL (PF) 1 % IJ SOLN: 30.0000 mL | INTRAMUSCULAR | Status: DC | PRN

## 2020-09-13 MED ORDER — ONDANSETRON HCL 4 MG/2ML IJ SOLN: 4.0000 mg | Freq: Four times a day (QID) | INTRAMUSCULAR | Status: DC | PRN

## 2020-09-13 NOTE — OB Triage Note (Signed)
Patient G3P2 [redacted]w[redacted]d presents to L&D with complaints of contractions since 0400 with vaginal bleeding. She reports no leaking of fluid but rates pain with contractions a 8/10. VSS. Monitors applied and assessing. CNM aware of pt arrival and RN assessment.

## 2020-09-13 NOTE — Anesthesia Procedure Notes (Signed)
Epidural Patient location during procedure: OB Start time: 09/13/2020 5:41 PM End time: 09/13/2020 5:48 PM  Staffing Anesthesiologist: Lenard Simmer, MD Resident/CRNA: Irving Burton, CRNA Performed: resident/CRNA   Preanesthetic Checklist Completed: patient identified, IV checked, site marked, risks and benefits discussed, surgical consent, monitors and equipment checked, pre-op evaluation and timeout performed  Epidural Patient position: sitting Prep: ChloraPrep Patient monitoring: heart rate, continuous pulse ox and blood pressure Approach: midline Location: L3-L4 Injection technique: LOR saline  Needle:  Needle type: Tuohy  Needle gauge: 17 G Needle length: 9 cm and 9 Needle insertion depth: 7 cm Catheter type: closed end flexible Catheter size: 19 Gauge Catheter at skin depth: 12 cm Test dose: negative and 1.5% lidocaine with Epi 1:200 K  Assessment Sensory level: T10 Events: blood not aspirated, injection not painful, no injection resistance, no paresthesia and negative IV test  Additional Notes 1st attempt Pt. Evaluated and documentation done after procedure finished. Patient identified. Risks/Benefits/Options discussed with patient including but not limited to bleeding, infection, nerve damage, paralysis, failed block, incomplete pain control, headache, blood pressure changes, nausea, vomiting, reactions to medication both or allergic, itching and postpartum back pain. Confirmed with bedside nurse the patient's most recent platelet count. Confirmed with patient that they are not currently taking any anticoagulation, have any bleeding history or any family history of bleeding disorders. Patient expressed understanding and wished to proceed. All questions were answered. Sterile technique was used throughout the entire procedure. Please see nursing notes for vital signs. Test dose was given through epidural catheter and negative prior to continuing to dose epidural or  start infusion. Warning signs of high block given to the patient including shortness of breath, tingling/numbness in hands, complete motor block, or any concerning symptoms with instructions to call for help. Patient was given instructions on fall risk and not to get out of bed. All questions and concerns addressed with instructions to call with any issues or inadequate analgesia.   Patient tolerated the insertion well without immediate complications.Reason for block:procedure for pain

## 2020-09-13 NOTE — H&P (Addendum)
OB History & Physical   History of Present Illness:  Chief Complaint: painful contractions since 0400  HPI:  Jordan Mccarthy is a 35 y.o. G33P2002 female at [redacted]w[redacted]d dated by LMP and c/w [place] at [redacted]w[redacted]d.  She presents to L&D for  Contractions with VB since this am. Denies LOF.    Pregnancy Issues: 1. Iron deficiency anemia, on PO supplements 2. Spanish speaking only   Maternal Medical History:   Past Medical History:  Diagnosis Date  . Anemia   . Hemorrhoid    dx 7 yrs ago per pt.    History reviewed. No pertinent surgical history.  No Known Allergies  Prior to Admission medications   Medication Sig Start Date End Date Taking? Authorizing Provider  Iron, Ferrous Sulfate, 325 (65 Fe) MG TABS Take 1 tablet by mouth 2 (two) times daily. 08/18/20 10/07/20 Yes Sciora, Austin Miles, CNM  Prenatal Vit-Fe Fumarate-FA (PRENATAL VITAMINS) 28-0.8 MG TABS Take 1 tablet by mouth daily. 08/18/20  Yes Sciora, Austin Miles, CNM     Prenatal care site: [facility]   Social History: She  reports that she has never smoked. She has never used smokeless tobacco. She reports that she does not drink alcohol and does not use drugs.  Family History: family history includes Asthma in her sister and son; Stomach cancer in her paternal grandmother.   Review of Systems: A full review of systems was performed and negative except as noted in the HPI.     Physical Exam:  Vital Signs: BP 125/80   Pulse 85   Temp 98.2 F (36.8 C) (Oral)   Resp 16   Ht 5\' 7"  (1.702 m)   Wt 83 kg   LMP 12/07/2019 (Approximate)   BMI 28.66 kg/m  General: no acute distress.  HEENT: normocephalic, atraumatic Heart: regular rate & rhythm.  No murmurs/rubs/gallops Lungs: clear to auscultation bilaterally, normal respiratory effort Abdomen: soft, gravid, non-tender;  EFW: 7lbs Pelvic:   External: Normal external female genitalia  Cervix: Dilation: 6 / Effacement (%): 90 / Station: Plus 1    Extremities:  non-tender, symmetric, no edema bilaterally.  DTRs: 2+  Neurologic: Alert & oriented x 3.    No results found for this or any previous visit (from the past 24 hour(s)).  Pertinent Results:  Prenatal Labs: Blood type/Rh A pos  Antibody screen neg  Rubella  no recs  Varicella Immune 06/2007  RPR NR  HBsAg Neg  HIV NR  GC neg  Chlamydia neg  Genetic screening negative  1 hour GTT  89  3 hour GTT  n/a  GBS  negative   FHT: 150bpm, mod variability, + accels, no decels TOCO: q51min SVE:  Dilation: 6 / Effacement (%): 90 / Station: Plus 1  - per nursing exam.    Cephalic by leopolds/SVE  No results found.  Assessment:  Jordan Mccarthy is a 35 y.o. G34P2002 female at [redacted]w[redacted]d with active labor.   Plan:  1. Admit to Labor & Delivery; consents reviewed and obtained - COVID swab on admit.  - expectant mgmt - elevated BP x 1 on admit, will get CMP and P/C ratio, continue to monitor closely.   2. Fetal Well being  - Fetal Tracing: Cat I tracing - Group B Streptococcus ppx indicated: negative - Presentation: cephalic confirmed by exam   3. Routine OB: - Prenatal labs reviewed, as above - Rh A Pos - CBC, T&S, RPR on admit - Clear fluids, IVF  4.  Monitoring of Labor -  Contractions: external toco in place -  Pelvis proven to 3800grams -  Plan for AROM if pt desires.  -  Plan for continuous fetal monitoring  -  Maternal pain control as desired; requesting regional anesthesia - Anticipate vaginal delivery  5. Post Partum Planning: - Infant feeding: breast and formula - Contraception: OCPs - Tdap 06/21/20 - Flu 05/10/20 - COVID vaccine x 2  Prudencio Pair Berlyn Saylor, CNM 09/13/20 5:03 PM

## 2020-09-13 NOTE — Anesthesia Preprocedure Evaluation (Signed)
Anesthesia Evaluation  Patient identified by MRN, date of birth, ID band Patient awake    Reviewed: Allergy & Precautions, H&P , NPO status , Patient's Chart, lab work & pertinent test results, reviewed documented beta blocker date and time   History of Anesthesia Complications Negative for: history of anesthetic complications  Airway Mallampati: II  TM Distance: >3 FB Neck ROM: full    Dental no notable dental hx.    Pulmonary neg shortness of breath, neg COPD, Recent URI  (COVID+),    Pulmonary exam normal breath sounds clear to auscultation       Cardiovascular Exercise Tolerance: Good negative cardio ROS Normal cardiovascular exam Rhythm:regular Rate:Normal     Neuro/Psych negative neurological ROS  negative psych ROS   GI/Hepatic negative GI ROS, Neg liver ROS,   Endo/Other  negative endocrine ROS  Renal/GU negative Renal ROS  negative genitourinary   Musculoskeletal   Abdominal   Peds  Hematology  (+) Blood dyscrasia, anemia ,   Anesthesia Other Findings Past Medical History: No date: Anemia No date: Hemorrhoid     Comment:  dx 7 yrs ago per pt.   Reproductive/Obstetrics (+) Pregnancy                             Anesthesia Physical Anesthesia Plan  ASA: II  Anesthesia Plan: Epidural   Post-op Pain Management:    Induction:   PONV Risk Score and Plan:   Airway Management Planned:   Additional Equipment:   Intra-op Plan:   Post-operative Plan:   Informed Consent: I have reviewed the patients History and Physical, chart, labs and discussed the procedure including the risks, benefits and alternatives for the proposed anesthesia with the patient or authorized representative who has indicated his/her understanding and acceptance.     Dental Advisory Given  Plan Discussed with: Anesthesiologist, CRNA and Surgeon  Anesthesia Plan Comments:         Anesthesia  Quick Evaluation

## 2020-09-13 NOTE — Discharge Summary (Signed)
Obstetrical Discharge Summary  Patient Name: Jordan Mccarthy DOB: 1986-01-31 MRN: [id number]  Date of Admission: 09/13/2020 Date of Delivery: 09/13/20 Delivered by: Heloise Ochoa CNM Date of Discharge: 09/14/2020  Primary OB:  [facility]  [id number] last menstrual period was 12/07/2019 (approximate). EDC Estimated Date of Delivery: 09/12/20 Gestational Age at Delivery: [redacted]w[redacted]d   Antepartum complications:  1. Iron deficiency anemia, on PO supplements 2. Spanish speaking only  Admitting Diagnosis: active labor Secondary Diagnosis: SVD  Patient Active Problem List   Diagnosis Date Noted  . NSVD (normal spontaneous vaginal delivery) 09/14/2020  . Acute blood loss anemia 09/14/2020  . Anemia affecting pregnancy 06/21/2020  . Encounter for supervision of normal pregnancy in multigravida in first trimester 02/23/2020  . Dental caries 02/23/2020  . Overweight BMI=27.3 08/13/2019    Augmentation: AROM Complications: None Intrapartum complications/course: pt arrived in active labor, received epidural for pain mgmt. AROM performed at C/C/+1.  Date of Delivery: 09/13/20 Delivered By: Heloise Ochoa CNM Delivery Type: spontaneous vaginal delivery Anesthesia: epidural Placenta: spontaneous Laceration: none Episiotomy: none Newborn Data: Live born female  Birth Weight:  6#13.7 (3110g) APGAR: 8, 9  Newborn Delivery   Birth date/time: 09/13/2020 18:26:00 Delivery type: Vaginal, Spontaneous      Postpartum Procedures: none  Edinburgh:  Edinburgh Postnatal Depression Scale Screening Tool 09/13/2020 09/13/2020  I have been able to laugh and see the funny side of things. 0 (No Data)  I have looked forward with enjoyment to things. 0 -  I have blamed myself unnecessarily when things went wrong. 0 -  I have been anxious or worried for no good reason. 0 -  I have felt scared or panicky for no good reason. 0 -  Things have been getting on top of me. 0 -  I have been so unhappy that I have  had difficulty sleeping. 0 -  I have felt sad or miserable. 0 -  I have been so unhappy that I have been crying. 0 -  The thought of harming myself has occurred to me. 0 -  Edinburgh Postnatal Depression Scale Total 0 -      Post partum course:  Patient had an uncomplicated postpartum course.  By time of discharge on PPD#1, her pain was controlled on oral pain medications; she had appropriate lochia and was ambulating, voiding without difficulty and tolerating regular diet.  She was deemed stable for discharge to home.    Discharge Physical Exam:  BP 108/70 (BP Location: Left Arm)   Pulse 85   Temp (!) 97.5 F (36.4 C) (Oral)   Resp 17   Ht 5\' 7"  (1.702 m)   Wt 83 kg   LMP 12/07/2019 (Approximate)   SpO2 100%   Breastfeeding Unknown   BMI 28.66 kg/m   General: NAD CV: RRR Pulm: CTABL, nl effort ABD: s/nd/nt, fundus firm and below the umbilicus Lochia: moderate Perineum: well approximated/intact DVT Evaluation: LE non-ttp, no evidence of DVT on exam.  Hemoglobin  Date Value Ref Range Status  09/14/2020 8.9 (L) 12.0 - 15.0 g/dL Final  09/16/2020 93/71/6967 (L) 11.1 - 15.9 g/dL Final   HCT  Date Value Ref Range Status  09/14/2020 26.1 (L) 36.0 - 46.0 % Final   Hematocrit  Date Value Ref Range Status  06/21/2020 30.7 (L) 34.0 - 46.6 % Final     Disposition: stable, discharge to home. Baby Feeding: breastmilk Baby Disposition: home with mom  Rh Immune globulin given: n/a Rubella vaccine given: still pending at d/c Varicella vaccine  given: still pending at d/c Tdap vaccine given in AP or PP setting: 06/21/20 Flu vaccine given in AP or PP setting: 05/10/20  Contraception: pills  Prenatal Labs:  Blood type/Rh A pos  Antibody screen neg  Rubella  pending  Varicella Immune per vaccine recs, pending   RPR NR  HBsAg Neg  HIV NR  GC neg  Chlamydia neg  Genetic screening negative  1 hour GTT  89  3 hour GTT  n/a  GBS  negative      Plan:  Jordan Mccarthy  was discharged to home in good condition. Follow-up appointment with delivering provider in 6 weeks.  Discharge Medications: Allergies as of 09/14/2020   No Known Allergies     Medication List    TAKE these medications   Iron (Ferrous Sulfate) 325 (65 Fe) MG Tabs Take 1 tablet by mouth 2 (two) times daily.   Prenatal Vitamins 28-0.8 MG Tabs Take 1 tablet by mouth daily.        Follow-up Information    McVey, Prudencio Pair, CNM. Schedule an appointment as soon as possible for a visit in 6 week(s).   Specialty: Obstetrics and Gynecology Why: You can follow up with [facility] instead if you prefer.   Contact information: [address] [place] [address] [phone]               Signed:  Cyril Mourning, CNM 09/14/2020  5:04 PM

## 2020-09-14 DIAGNOSIS — D62 Acute posthemorrhagic anemia: Secondary | ICD-10-CM | POA: Diagnosis not present

## 2020-09-14 LAB — CBC
HCT: 26.1 % — ABNORMAL LOW (ref 36.0–46.0)
Hemoglobin: 8.9 g/dL — ABNORMAL LOW (ref 12.0–15.0)
MCH: 29.5 pg (ref 26.0–34.0)
MCHC: 34.1 g/dL (ref 30.0–36.0)
MCV: 86.4 fL (ref 80.0–100.0)
Platelets: 223 10*3/uL (ref 150–400)
RBC: 3.02 MIL/uL — ABNORMAL LOW (ref 3.87–5.11)
RDW: 14.6 % (ref 11.5–15.5)
WBC: 15.8 10*3/uL — ABNORMAL HIGH (ref 4.0–10.5)
nRBC: 0 % (ref 0.0–0.2)

## 2020-09-14 LAB — RPR: RPR Ser Ql: NONREACTIVE

## 2020-09-14 MED ORDER — IBUPROFEN 600 MG PO TABS
600.0000 mg | ORAL_TABLET | Freq: Four times a day (QID) | ORAL | Status: DC
  Administered 2020-09-14: 600 mg via ORAL
  Filled 2020-09-14 (×2): qty 1

## 2020-09-14 NOTE — Progress Notes (Signed)
Pt d/c home in the company of husband and baby. D/c instructions reviewed with patient. Verbalized understanding. IV d/c'd.

## 2020-09-14 NOTE — Anesthesia Postprocedure Evaluation (Signed)
Anesthesia Post Note  Patient: Jordan Mccarthy  Procedure(s) Performed: AN AD HOC LABOR EPIDURAL  Patient location during evaluation: Mother Baby Anesthesia Type: Epidural Level of consciousness: awake and alert Pain management: pain level controlled Vital Signs Assessment: post-procedure vital signs reviewed and stable Respiratory status: spontaneous breathing, nonlabored ventilation and respiratory function stable Cardiovascular status: stable Postop Assessment: no headache, no backache and epidural receding Anesthetic complications: no   No complications documented.   Last Vitals:  Vitals:   09/14/20 0645 09/14/20 0803  BP: 102/69 108/70  Pulse: 85   Resp: 18 17  Temp: 37.1 C (!) 36.4 C  SpO2: 100% 100%    Last Pain:  Vitals:   09/14/20 0807  TempSrc:   PainSc: 0-No pain                 Bachich,  Alessandra Bevels

## 2020-09-15 LAB — MEASLES/MUMPS/RUBELLA IMMUNITY
Mumps IgG: <9 AU/mL — ABNORMAL LOW (ref >10.9)
Rubella: 1.48 index (ref >0.99)
Rubeola IgG: <13.5 AU/mL — ABNORMAL LOW (ref >16.4)

## 2020-09-15 LAB — VARICELLA ZOSTER ANTIBODY, IGG: Varicella IgG: 1615 index (ref >165)

## 2020-09-17 ENCOUNTER — Ambulatory Visit: Payer: Self-pay

## 2020-10-26 ENCOUNTER — Encounter: Payer: Self-pay | Admitting: Advanced Practice Midwife

## 2020-10-26 ENCOUNTER — Other Ambulatory Visit: Payer: Self-pay

## 2020-10-26 ENCOUNTER — Ambulatory Visit: Payer: Self-pay | Admitting: Advanced Practice Midwife

## 2020-10-26 LAB — HEMOGLOBIN, FINGERSTICK: Hemoglobin: 11.4 g/dL (ref 11.1–15.9)

## 2020-10-26 MED ORDER — NORETHINDRONE 0.35 MG PO TABS: 1.0000 | ORAL_TABLET | Freq: Every day | ORAL | 13 refills | Status: DC

## 2020-10-26 NOTE — Progress Notes (Addendum)
Hgb reviewed. Instructed patient to continue with a PNV while breastfeeding. #12 packs of OCP's given. Instructed patient to use 02/20/2021 pills first due to expiration date. Tawny Hopping, RN

## 2020-10-26 NOTE — Progress Notes (Signed)
Here today for PP check up. SVD 09/13/20 at [facility]. Last Pap Smear here was 03/13/20 (NIL.) Declines all STD screening. Wants OCP's for PP BCM. Tawny Hopping, RN

## 2020-10-26 NOTE — Progress Notes (Signed)
Post Partum Exam  Jordan Mccarthy is a 35 y.o. SHF nonsmoker G3P3003 (12, 7, infant) female who presents for a postpartum visit. She is 6 weeks postpartum following a spontaneous vaginal delivery after AROM and epidural on 09/13/20 F 6#13.  Exclusively breastfeeding at night and 4x/day + formula 2-3x/day. Living with boyfriend and her 4 kids; no one helps her with her kids.  No more lochia.  LMP 10/15/20.  Last sex 11/2019.  Denies cigs, MJ. Last ETOH 10/2019 (2 beers).. I have fully reviewed the prenatal and intrapartum course. The delivery was at 40 1/7 gestational weeks.  Anesthesia: epidural. Postpartum course has been wnl. Baby's course has been wnl. Baby is feeding by both bottle and breastmilk Bleeding no bleeding. Bowel function is normal. Bladder function is normal. Patient is not sexually active. Contraception method is abstinence.   Postpartum depression screening:    The following portions of the patient's history were reviewed and updated as appropriate: allergies, current medications, past family history, past medical history, past social history, past surgical history and problem list. Last pap smear done 02/23/20  and was Normal  Review of Systems Pertinent items are noted in HPI.    Objective:  BP 104/74   Ht 5\' 7"  (1.702 m)   Wt 170 lb 9.6 oz (77.4 kg)   LMP 10/12/2020 (Approximate)   Breastfeeding Yes   BMI 26.72 kg/m   Gen: well appearing, NAD HEENT: no scleral icterus CV: RR Lung: Normal WOB Breast:performed-yes  Ext: warm well perfused Abdomen soft without masses or tenderness  GU: wnl Uterus: NSSC Rectal: performed -  not indicated       Assessment:    6 wk postpartum exam. Pap smear not done at today's visit.   Plan:   Essential components of care per ACOG recommendations for Comprehensive Postpartum exam:  1.  Mood and well being: Patient with negative depression screening today. Reviewed local resources for support. EPDS is low risk. Reviewed  resources and that mood sx in first year after pregnancy are considered related to pregnancy and to reach out for help at [facility] if needed. Discussed [facility] as link to care and availability of LCSW for counseling  - Patient does not use tobacco.  - hx of drug use? No    2. Infant care and feeding:  -Patient currently breastmilk feeding? Yes and formula feeding. If breastmilk feeding discussed return to work and pumping. If needed, patient was provided letter for work to allow for every 2-3 hr pumping breaks, and to be granted a private location to express breastmilk and refrigerated area to store breastmilk. Reviewed importance of draining breast regularly to support lactation. I  -Recommended patient engage with WIC/BFpeer counselors  -Counseled to sign new child up for [facility] services -Social determinants of health (SDOH) reviewed in EPIC. No concerns  3. Sexuality, contraception and birth spacing  Contraception: Contraception counseling: Reviewed all forms of birth control options in the tiered based approach. available including abstinence; over the counter/barrier methods; hormonal contraceptive medication including pill, patch, ring, injection,contraceptive implant; hormonal and nonhormonal IUDs; permanent sterilization options including vasectomy and the various tubal sterilization modalities. Risks, benefits, and typical effectiveness rates were reviewed.  Questions were answered.  Written information was also given to the patient to review.  Patient desires ocp's, this was prescribed for patient. She will follow up in  prn for surveillance.  She was told to call with any further questions, or with any concerns about this method of contraception.  Emphasized use of condoms 100% of the time for STI prevention.  Patient was offered ECP. ECP was not accepted by the patient. ECP counseling was not given - see RN documentation  - Patient does not want a pregnancy in the next year.  Desired family size is  3 children.  - Reviewed forms of contraception in tiered fashion. Patient desired oral progesterone-only contraceptive today.   - Discussed birth spacing of 18 months  4. Sleep and fatigue -Encouraged family/partner/community support of 4 hrs of uninterrupted sleep to help with mood and fatigue  5. Physical Recovery  - Discussed patients delivery and complications -  perineal healing reviewed. Patient expressed understanding - Patient has urinary incontinence? No - Patient is safe to resume physical and sexual activity  6.  Health Maintenance/Chronic Disease - Last pap smear performed 02/23/20 and was normal   1. Postpartum exam Micronor #13 I po daily to begin today Please counsel on need for abstinance/back up condoms next 7 days Return when giving more formula than breastmilk for cocp's Has f/u dental exam 11/06/20 - Hemoglobin, venipuncture   Patient given handout about PCP care in the community Given MVI per family planning program guidelines and availability  Follow up in: prn or when giving more formula than breastmilk or as needed.

## 2021-01-20 ENCOUNTER — Ambulatory Visit (LOCAL_COMMUNITY_HEALTH_CENTER): Payer: Self-pay

## 2021-01-20 ENCOUNTER — Other Ambulatory Visit: Payer: Self-pay

## 2021-01-20 VITALS — BP 110/70 | Ht 67.0 in | Wt 170.5 lb

## 2021-01-20 DIAGNOSIS — Z3041 Encounter for surveillance of contraceptive pills: Secondary | ICD-10-CM

## 2021-01-20 DIAGNOSIS — Z3009 Encounter for other general counseling and advice on contraception: Secondary | ICD-10-CM

## 2021-01-20 MED ORDER — NORGESTIMATE-ETH ESTRADIOL 0.25-35 MG-MCG PO TABS: 1.0000 | ORAL_TABLET | Freq: Every day | ORAL | 0 refills | Status: DC

## 2021-01-20 NOTE — Progress Notes (Signed)
Consulted by RN re: patient situation.  Reviewed RN note and agree that it reflects our discussion and my recommendations. 

## 2021-01-20 NOTE — Progress Notes (Signed)
In Nurse Clinic for ocp. Pt reports she is currently taking Micronor which she has been on since pp visit 10/26/2020. She denies missing any pills and takes them same time daily. Reports breastfeeding only sometimes and using formula majority of feedings. States her milk supply has decreased.Pt explains she wants to switch to birth control pill that she took before pregnancy, Sprintec. Consult C. Tom Bean, [place] who orders Sprintec (ortho cyclen) #10 packs with instructions to take one pill by mouth daily at the same time each day. Provider orders pt to complete current pack of Micronor before starting Sprintec and advises RN to counsel pt that breast milk production may decrease. RN carried out provider orders. RN dispensed Sprintec # 6 packs today d/t expiration date. Instructions explained. Pt advised to contact [facility] for appt when begins last pack of Sprintec (last pack labeled) so that she will not run out of pills before next visit. Questions answered and reports understanding. M. [place], interpreter. Jerel Shepherd, RN

## 2021-07-20 ENCOUNTER — Other Ambulatory Visit: Payer: Self-pay

## 2021-07-20 ENCOUNTER — Ambulatory Visit (LOCAL_COMMUNITY_HEALTH_CENTER): Payer: Self-pay

## 2021-07-20 VITALS — BP 111/73 | Ht 67.0 in | Wt 164.5 lb

## 2021-07-20 DIAGNOSIS — Z3041 Encounter for surveillance of contraceptive pills: Secondary | ICD-10-CM

## 2021-07-20 DIAGNOSIS — Z3009 Encounter for other general counseling and advice on contraception: Secondary | ICD-10-CM

## 2021-07-20 MED ORDER — NORGESTIMATE-ETH ESTRADIOL 0.25-35 MG-MCG PO TABS: 1.0000 | ORAL_TABLET | Freq: Every day | ORAL | 0 refills | Status: DC

## 2021-07-20 NOTE — Progress Notes (Signed)
In Nurse Clinic for ocp refill (Sprintec). Denies missing any pills and takes same time daily. Has 2 weeks remaining in current pack. Due for Sprintec #4 packs to complete order by Beatris Si, PA dated 01/20/2021. Sprintec (Norgestimate and ethinyl estradiol) #4 packs dispensed today. Advised to contact [facility] for annual PE when begins last pack (labeled with this message). Juliene Pina, interpreter. Jerel Shepherd, RN

## 2021-11-08 ENCOUNTER — Encounter: Payer: Self-pay | Admitting: Family Medicine

## 2021-11-08 ENCOUNTER — Ambulatory Visit (LOCAL_COMMUNITY_HEALTH_CENTER): Payer: Self-pay | Admitting: Family Medicine

## 2021-11-08 VITALS — BP 106/74 | Ht 67.0 in | Wt 165.8 lb

## 2021-11-08 DIAGNOSIS — Z30011 Encounter for initial prescription of contraceptive pills: Secondary | ICD-10-CM

## 2021-11-08 DIAGNOSIS — F32A Depression, unspecified: Secondary | ICD-10-CM

## 2021-11-08 DIAGNOSIS — Z309 Encounter for contraceptive management, unspecified: Secondary | ICD-10-CM

## 2021-11-08 DIAGNOSIS — Z0000 Encounter for general adult medical examination without abnormal findings: Secondary | ICD-10-CM

## 2021-11-08 MED ORDER — NORGESTIM-ETH ESTRAD TRIPHASIC 0.18/0.215/0.25 MG-25 MCG PO TABS: 1.0000 | ORAL_TABLET | Freq: Every day | ORAL | 5 refills | Status: DC

## 2021-11-08 MED ORDER — NORGESTIM-ETH ESTRAD TRIPHASIC 0.18/0.215/0.25 MG-25 MCG PO TABS: 1.0000 | ORAL_TABLET | Freq: Every day | ORAL | 12 refills | Status: DC

## 2021-11-08 NOTE — Progress Notes (Signed)
[facility] ?[facility] ?[facility] ?Main Number: [phone] ? ?Family Planning Visit- Repeat Yearly Visit ? ?Subjective:  ?Jordan Mccarthy is a 36 y.o. G3P3003  being seen today for an annual wellness visit and to discuss contraception options.   The patient is currently using Oral Contraceptive for pregnancy prevention. Patient does not want a pregnancy in the next year.  ? ? report they are looking for a method that provides Method that is not associated with weight gain and Method they can control starting/stopping ? ? ?Patient has the following medical problems: has Overweight; Dental caries; and Acute blood loss anemia on their problem list. ? ?Chief Complaint  ?Patient presents with  ? Annual Exam  ?  BC Pills  ? ? ?Patient reports here for physical and more OCP  ? ?Patient denies any other concerns than those listed on intake form. See form.   ? ?See flowsheet for other program required questions.  ? ?Body mass index is 25.97 kg/m?. - Patient is eligible for diabetes screening based on BMI and age [id number]?  not applicable ?HA1C ordered? not applicable ? ?Patient reports 1 of partners in last year. Desires STI screening?  No - denies  ? ? ?Has patient been screened once for HCV in the past?  Yes ? No results found for: HCVAB ? ?Does the patient have current of drug use, have a partner with drug use, and/or has been incarcerated since last result? No  ?If yes-- Screen for HCV through [place] ?  ?Does the patient meet criteria for HBV testing? No ? ?Criteria:  ?-Household, sexual or needle sharing contact with HBV ?-History of drug use ?-HIV positive ?-Those with known Hep C ? ? ?There are no preventive care reminders to display for this patient. ? ?Review of Systems  ?Constitutional:  Negative for chills, fever, malaise/fatigue and weight loss.  ?HENT:  Negative for congestion, hearing loss and sore throat.   ?Eyes:  Negative for blurred vision, double  vision and photophobia.  ?Respiratory:  Negative for shortness of breath.   ?Cardiovascular:  Negative for chest pain.  ?Gastrointestinal:  Negative for abdominal pain, blood in stool, constipation, diarrhea, heartburn, nausea and vomiting.  ?Genitourinary:  Negative for dysuria and frequency.  ?Musculoskeletal:  Negative for back pain, joint pain and neck pain.  ?Skin:  Negative for itching and rash.  ?Neurological:  Negative for dizziness, weakness and headaches.  ?Endo/Heme/Allergies:  Does not bruise/bleed easily.  ?Psychiatric/Behavioral:  Negative for depression, substance abuse and suicidal ideas.   ? ?The following portions of the patient's history were reviewed and updated as appropriate: allergies, current medications, past family history, past medical history, past social history, past surgical history and problem list. Problem list updated. ? ?Objective:  ? ?Vitals:  ? 11/08/21 1002  ?BP: 106/74  ?Weight: 165 lb 12.8 oz (75.2 kg)  ?Height: 5\' 7"  (1.702 m)  ? ? ?Physical Exam ?Vitals and nursing note reviewed.  ?Constitutional:   ?   Appearance: Normal appearance.  ?HENT:  ?   Head: Normocephalic and atraumatic.  ?   Mouth/Throat:  ?   Mouth: Mucous membranes are moist.  ?   Dentition: Abnormal dentition. No dental caries.  ?   Pharynx: No oropharyngeal exudate or posterior oropharyngeal erythema.  ?   Comments: Teeth missing  ?Eyes:  ?   General: No scleral icterus. ?Neck:  ?   Thyroid: No thyroid mass, thyromegaly or thyroid tenderness.  ?Cardiovascular:  ?  Rate and Rhythm: Normal rate.  ?   Pulses: Normal pulses.  ?Pulmonary:  ?   Effort: Pulmonary effort is normal.  ?Chest:  ?   Comments: Breasts:  ?      Right: Normal. No swelling, mass, nipple discharge, skin change or tenderness.  ?      Left: Normal. No swelling, mass, nipple discharge, skin change or tenderness.  ? ?Abdominal:  ?   General: Abdomen is flat. Bowel sounds are normal.  ?   Palpations: Abdomen is soft.  ?Musculoskeletal:     ?    General: Normal range of motion.  ?Skin: ?   General: Skin is warm and dry.  ?Neurological:  ?   General: No focal deficit present.  ?   Mental Status: She is alert and oriented to person, place, and time.  ?Psychiatric:     ?   Mood and Affect: Mood is depressed.     ?   Behavior: Behavior normal.     ?   Thought Content: Thought content normal. Thought content does not include homicidal or suicidal ideation. Thought content does not include homicidal or suicidal plan.  ? ? ? ? ?Assessment and Plan:  ?[name] is a 36 y.o. female G3P3003 presenting to the [facility] Department for an yearly wellness and contraception visit ? ? ?Contraception counseling: Reviewed options based on patient desire and reproductive life plan. Patient is interested in Oral Contraceptive. This was provided to the patient today.  ? ?Risks, benefits, and typical effectiveness rates were reviewed.  Questions were answered.  Written information was also given to the patient to review.   ? ?The patient will follow up in  6 months for surveillance.  The patient was told to call with any further questions, or with any concerns about this method of contraception.  Emphasized use of condoms 100% of the time for STI prevention. ? ?Patient was assessed for need for ECP. Patient was not offered ECP based on last sex was > 14 days ago.   Patient is within 21 days of unprotected sex.  ? ?1. Routine general medical examination at a health care facility ?Well woman exam  ?CBE today - pt has history of fatty breast lumps (per patient) recommended patient do monthly SBE.  ?Pap due 02/22/2025  ? ?2. Encounter for initial prescription of contraceptive pills ?Pt reports loss of sex drive, discussed with patient about trying OCP with less estrogen.   ?Pt in agreement to try different pill.  Will try 6 months, if patient likes this OCP.  Will continue with pills.  ? ?- Norgestimate-Ethinyl Estradiol Triphasic 0.18/0.215/0.25 MG-25 MCG tab;  Take 1 tablet by mouth daily.  Dispense: 28 tablet; Refill: 12 ? ?3. Depression, unspecified depression type ?Reports not feeling completely happy even before last pregnancy .  Loss of sex drive. ?Referral sent to A. Marchia Bond, LCSW  ? ?- Ambulatory referral to [facility] ? ? ?.[facility] agency interpreter used for Spanish interpretation.     ? ?Return in about 6 months (around 05/10/2022) for birth control follow up . ? ?Future Appointments  ?Date Time Provider [place]  ?11/08/2021 11:00 AM Junious Dresser, FNP [facility] None  ? ? ?Junious Dresser, FNP ?

## 2021-11-08 NOTE — Progress Notes (Signed)
Pt here for annual exam and birth control pills. Pt given 6 months of BCP but can have 6 more packs on return to clinic after trying out new dose (per provider).  Lethea Killings RN ?

## 2022-02-02 ENCOUNTER — Encounter: Payer: Self-pay | Admitting: Gerontology

## 2022-02-02 ENCOUNTER — Other Ambulatory Visit: Payer: Self-pay

## 2022-02-02 ENCOUNTER — Ambulatory Visit: Payer: Self-pay | Admitting: Gerontology

## 2022-02-02 VITALS — BP 100/68 | HR 71 | Temp 97.6°F | Ht 62.0 in | Wt 160.3 lb

## 2022-02-02 DIAGNOSIS — Z7689 Persons encountering health services in other specified circumstances: Secondary | ICD-10-CM

## 2022-02-02 DIAGNOSIS — K0889 Other specified disorders of teeth and supporting structures: Secondary | ICD-10-CM | POA: Insufficient documentation

## 2022-02-02 DIAGNOSIS — K029 Dental caries, unspecified: Secondary | ICD-10-CM

## 2022-02-02 HISTORY — DX: Persons encountering health services in other specified circumstances: Z76.89

## 2022-02-02 NOTE — Progress Notes (Signed)
New Patient Office Visit  Subjective    Patient ID: Jordan Mccarthy, female    DOB: [birth year]  Age: 36 y.o. MRN: [id number]  CC:  Chief Complaint  Patient presents with   Establish Care    HPI Jordan Mccarthy is a 36 year old female with no significant medical history presents to establish care.  She denies any chest pain, palpitation, dizziness, fever and chills.  She reports having dental caries to multiple teeth, denies toothache, swelling and erythema.  She requests dental referral.  Overall, she states that she is doing well on the offers no further complaint.   Outpatient Encounter Medications as of 02/02/2022  Medication Sig   Iron, Ferrous Sulfate, 325 (65 Fe) MG TABS Take 1 tablet by mouth 2 (two) times daily. (Patient taking differently: Take 1 tablet by mouth daily.)   Norgestimate-Ethinyl Estradiol Triphasic 0.18/0.215/0.25 MG-25 MCG tab Take 1 tablet by mouth daily.   Prenatal Vit-Fe Fumarate-FA (PRENATAL VITAMINS) 28-0.8 MG TABS Take 1 tablet by mouth daily. (Patient not taking: Reported on 01/20/2021)   No facility-administered encounter medications on file as of 02/02/2022.    Past Medical History:  Diagnosis Date   Anemia    Hemorrhoid    dx 7 yrs ago per pt.    Past Surgical History:  Procedure Laterality Date   NO PAST SURGERIES      Family History  Problem Relation Age of Onset   Lung disease Father    Asthma Sister    Asthma Son    Stomach cancer Paternal Grandmother     Social History   Socioeconomic History   Marital status: Single    Spouse name: Ranulfo Counsellor   Number of children: 3   Years of education: Not on file   Highest education level: 9th grade  Occupational History   Occupation: Unemployed  Tobacco Use   Smoking status: Never   Smokeless tobacco: Never  Vaping Use   Vaping Use: Never used  Substance and Sexual Activity   Alcohol use: Not Currently    Alcohol/week: 2.0 standard drinks of alcohol    Types: 2 Cans  of beer per week    Comment: last use 10/2019   Drug use: Never   Sexual activity: Yes    Partners: Male    Birth control/protection: Pill    Comment: last sex around 10/18/2021  Other Topics Concern   Not on file  Social History Narrative   Not on file   Social Determinants of Health   Financial Resource Strain: Not on file  Food Insecurity: No Food Insecurity (02/02/2022)   Hunger Vital Sign    Worried About Running Out of Food in the Last Year: Never true    Ran Out of Food in the Last Year: Never true  Transportation Needs: No Transportation Needs (02/02/2022)   PRAPARE - Hydrologist (Medical): No    Lack of Transportation (Non-Medical): No  Physical Activity: Not on file  Stress: Not on file  Social Connections: Not on file  Intimate Partner Violence: Not At Risk (11/08/2021)   Humiliation, Afraid, Rape, and Kick questionnaire    Fear of Current or Ex-Partner: No    Emotionally Abused: No    Physically Abused: No    Sexually Abused: No    Review of Systems  Constitutional: Negative.   HENT: Negative.    Eyes: Negative.   Respiratory: Negative.    Cardiovascular: Negative.   Gastrointestinal: Negative.   Genitourinary:  Negative.   Musculoskeletal: Negative.   Skin: Negative.   Neurological: Negative.   Endo/Heme/Allergies: Negative.   Psychiatric/Behavioral: Negative.          Objective    BP 100/68 (BP Location: Right Arm, Patient Position: Sitting, Cuff Size: Large)   Pulse 71   Temp 97.6 F (36.4 C) (Oral)   Ht _0  (1.575 m)   Wt 160 lb 4.8 oz (72.7 kg)   LMP 01/14/2022   SpO2 98%   BMI 29.32 kg/m   Physical Exam HENT:     Head: Normocephalic and atraumatic.     Nose: Nose normal.     Mouth/Throat:     Mouth: Mucous membranes are moist.     Dentition: Dental caries (right and lower inner mandibular teeth.) present.  Eyes:     Extraocular Movements: Extraocular movements intact.     Conjunctiva/sclera:  Conjunctivae normal.     Pupils: Pupils are equal, round, and reactive to light.  Cardiovascular:     Rate and Rhythm: Normal rate and regular rhythm.     Pulses: Normal pulses.     Heart sounds: Normal heart sounds.  Pulmonary:     Effort: Pulmonary effort is normal.     Breath sounds: Normal breath sounds.  Abdominal:     General: Bowel sounds are normal.     Palpations: Abdomen is soft.  Genitourinary:    Comments: Deferred per patient Musculoskeletal:        General: Normal range of motion.     Cervical back: Normal range of motion.  Skin:    General: Skin is warm.  Neurological:     General: No focal deficit present.     Mental Status: She is alert and oriented to person, place, and time. Mental status is at baseline.  Psychiatric:        Mood and Affect: Mood normal.        Behavior: Behavior normal.        Thought Content: Thought content normal.        Judgment: Judgment normal.         Assessment & Plan:   1. Encounter to establish care -Routine labs will be checked - CBC w/Diff; Future - Comp Met (CMET); Future - Lipid panel; Future - HgB A1c; Future - Urinalysis; Future - Iron Binding Cap (TIBC)([facility]); Future - Iron Binding Cap (TIBC)([facility]) - Urinalysis - HgB A1c - Lipid panel - Comp Met (CMET) - CBC w/Diff  2. Dental caries -She has dental caries to multiple teeth, provided dental application for referral.   Return in about 3 weeks (around 02/23/2022), or if symptoms worsen or fail to improve.   Emera Bussie Jerold Coombe, NP

## 2022-02-03 LAB — CBC WITH DIFFERENTIAL/PLATELET
Basophils Absolute: 0 10*3/uL (ref 0.0–0.2)
Basos: 0 %
EOS (ABSOLUTE): 0.1 10*3/uL (ref 0.0–0.4)
Eos: 1 %
Hematocrit: 36.7 % (ref 34.0–46.6)
Hemoglobin: 12.2 g/dL (ref 11.1–15.9)
Immature Grans (Abs): 0 10*3/uL (ref 0.0–0.1)
Immature Granulocytes: 0 %
Lymphocytes Absolute: 2.2 10*3/uL (ref 0.7–3.1)
Lymphs: 29 %
MCH: 29.1 pg (ref 26.6–33.0)
MCHC: 33.2 g/dL (ref 31.5–35.7)
MCV: 88 fL (ref 79–97)
Monocytes Absolute: 0.5 10*3/uL (ref 0.1–0.9)
Monocytes: 6 %
Neutrophils Absolute: 4.7 10*3/uL (ref 1.4–7.0)
Neutrophils: 64 %
Platelets: 369 10*3/uL (ref 150–450)
RBC: 4.19 x10E6/uL (ref 3.77–5.28)
RDW: 13.1 % (ref 11.7–15.4)
WBC: 7.5 10*3/uL (ref 3.4–10.8)

## 2022-02-03 LAB — COMPREHENSIVE METABOLIC PANEL
ALT: 14 IU/L (ref 0–32)
AST: 15 IU/L (ref 0–40)
Albumin/Globulin Ratio: 1.4 (ref 1.2–2.2)
Albumin: 4.3 g/dL (ref 3.9–4.9)
Alkaline Phosphatase: 59 IU/L (ref 44–121)
BUN/Creatinine Ratio: 16 (ref 9–23)
BUN: 11 mg/dL (ref 6–20)
Bilirubin Total: 0.3 mg/dL (ref 0.0–1.2)
CO2: 24 mmol/L (ref 20–29)
Calcium: 9.1 mg/dL (ref 8.7–10.2)
Chloride: 104 mmol/L (ref 96–106)
Creatinine, Ser: 0.69 mg/dL (ref 0.57–1.00)
Globulin, Total: 3 g/dL (ref 1.5–4.5)
Glucose: 84 mg/dL (ref 70–99)
Potassium: 4.2 mmol/L (ref 3.5–5.2)
Sodium: 140 mmol/L (ref 134–144)
Total Protein: 7.3 g/dL (ref 6.0–8.5)
eGFR: 117 mL/min/{1.73_m2} (ref >59)

## 2022-02-03 LAB — URINALYSIS
Bilirubin, UA: NEGATIVE
Glucose, UA: NEGATIVE
Leukocytes,UA: NEGATIVE
Nitrite, UA: NEGATIVE
Protein,UA: NEGATIVE
RBC, UA: NEGATIVE
Specific Gravity, UA: 1.025 (ref 1.005–1.030)
Urobilinogen, Ur: 1 mg/dL (ref 0.2–1.0)
pH, UA: 8 — ABNORMAL HIGH (ref 5.0–7.5)

## 2022-02-03 LAB — LIPID PANEL
Chol/HDL Ratio: 4.6 ratio — ABNORMAL HIGH (ref 0.0–4.4)
Cholesterol, Total: 259 mg/dL — ABNORMAL HIGH (ref 100–199)
HDL: 56 mg/dL (ref >39)
LDL Chol Calc (NIH): 186 mg/dL — ABNORMAL HIGH (ref 0–99)
Triglycerides: 97 mg/dL (ref 0–149)
VLDL Cholesterol Cal: 17 mg/dL (ref 5–40)

## 2022-02-03 LAB — HEMOGLOBIN A1C
Est. average glucose Bld gHb Est-mCnc: 103 mg/dL
Hgb A1c MFr Bld: 5.2 % (ref 4.8–5.6)

## 2022-02-03 LAB — IRON AND TIBC
Iron Saturation: 19 % (ref 15–55)
Iron: 85 ug/dL (ref 27–159)
Total Iron Binding Capacity: 442 ug/dL (ref 250–450)
UIBC: 357 ug/dL (ref 131–425)

## 2022-02-23 ENCOUNTER — Ambulatory Visit: Payer: Self-pay | Admitting: Gerontology

## 2022-03-02 ENCOUNTER — Ambulatory Visit: Payer: Self-pay | Admitting: Gerontology

## 2022-03-02 ENCOUNTER — Encounter: Payer: Self-pay | Admitting: Gerontology

## 2022-03-02 ENCOUNTER — Other Ambulatory Visit: Payer: Self-pay

## 2022-03-02 VITALS — BP 106/70 | HR 71 | Temp 97.9°F | Resp 16 | Ht 62.0 in | Wt 160.8 lb

## 2022-03-02 DIAGNOSIS — E785 Hyperlipidemia, unspecified: Secondary | ICD-10-CM

## 2022-03-02 NOTE — Progress Notes (Signed)
Patient's visit completed with the help of [employer] language services, id # [id number].

## 2022-03-02 NOTE — Patient Instructions (Addendum)
Colesterol elevado High Cholesterol  El colesterol elevado es una afeccin que se caracteriza porque la sangre tiene niveles altos de una sustancia blanca, cerosa y parecida a la grasa (colesterol). El hgado fabrica todo el colesterol que el organismo necesita. El organismo humano necesita pequeas cantidades de colesterol para formar las clulas. El exceso de colesterol se obtiene de los alimentos que se consumen. La sangre transporta el colesterol desde el hgado al resto del cuerpo. Si tiene el colesterol elevado, pueden acumularse depsitos (placas) en las paredes de las arterias. Las arterias son los vasos sanguneos que transportan la sangre desde el corazn al resto del cuerpo. Las placas causan que las arterias se estrechen y se endurezcan. Las placas de colesterol aumentan el riesgo de sufrir un infarto de miocardio y un accidente cerebrovascular. Trabaje con el mdico para mantener las concentraciones de colesterol en un rango saludable. Qu incrementa el riesgo? Los siguientes factores pueden hacer que sea ms propenso a desarrollar esta afeccin: Consumir alimentos con alto contenido de grasa animal (grasa saturada) o colesterol. Tener sobrepeso. No hacer suficiente ejercicio fsico. Tener antecedentes familiares de colesterol alto (hipercolesterolemia familiar). Consumir productos con tabaco. Tener diabetes. Cules son los signos o sntomas? En la mayora de los casos, el colesterol alto no causa ningn sntoma por lo general. En los casos graves, los niveles muy altos de colesterol pueden causar: Protuberancias de grasa debajo de la piel (xantomas). Un anillo blanco o gris alrededor del centro negro (pupila) del ojo. Cmo se diagnostica? Esta afeccin se puede diagnosticar en funcin de los resultados de un anlisis de sangre. Si es mayor de 20 aos, es posible que el mdico le controle el nivel de colesterol cada 4 a 6 aos. Los controles pueden ser ms frecuentes si tiene el  colesterol elevado u otros factores de riesgo de enfermedad cardaca. En el anlisis de sangre de colesterol, se determina lo siguiente: El colesterol "malo", o colesterol LDL. Este es el principal tipo de colesterol que causa enfermedades cardacas. El nivel recomendado es de menos de 100 mg/dl (2.59 mmol/l). El colesterol "bueno", o colesterol HDL. El HDL ayuda a proteger contra la enfermedad cardaca porque limpia las arterias y arrastra el LDL al hgado para que lo procese. El nivel recomendado de HDL es de 60 mg/dl (1.55 mmol/l) o ms. Triglicridos. Estos son grasas que el organismo puede almacenar o quemar como fuente de energa. El nivel recomendado es de menos de 150 mg/dl (1.69 mmol/l). Colesterol total. Mide la cantidad total de colesterol en la sangre, e incluye el colesterol LDL, el colesterol HDL y los triglicridos. El nivel recomendado es de menos de 200 mg/dl (5.17 mmol/l). Cmo se trata? El tratamiento para el colesterol alto comienza con cambios en el estilo de vida, tales como dieta y ejercicio. Cambios en la dieta. Es posible que le indiquen que consuma alimentos con ms fibra y menos grasas saturadas o azcar agregada. Cambios en el estilo de vida. Estos pueden incluir hacer actividad fsica con regularidad, mantener un peso saludable y dejar de consumir productos con tabaco. Medicamentos. Estos se administran cuando los cambios en la dieta y en el estilo de vida no han sido eficaces. Es posible que le receten medicamentos llamados estatinas para bajar sus niveles de colesterol. Siga estas instrucciones en su casa: Comida y bebida  Siga una dieta saludable y equilibrada. Esta dieta incluye lo siguiente: Porciones diarias de frutas y verduras frescas, congeladas o enlatadas. Porciones diarias de alimentos integrales con alto contenido de fibra. Alimentos   con bajo contenido de grasas saturadas y grasas trans. Estos incluyen carne de ave y pescado sin piel, cortes de carne magros  y productos lcteos descremados. Una variedad de pescado, especialmente pescado graso que contenga cidos grasos omega 3. Propngase comer pescado al menos dos veces por semana. Evite los alimentos y las bebidas que tengan azcar agregada. Use mtodos de coccin saludables, como asar, grillar, hervir, hornear, escalfar, cocer al vapor y saltear. No fra los alimentos excepto para saltearlos. Si bebe alcohol: Limite la cantidad que bebe a lo siguiente: De 0 a 1 medida por da para las mujeres que no estn embarazadas. De 0 a 2 medidas por da para los hombres. Sepa cunta cantidad de alcohol hay en las bebidas. En los [place], una medida equivale a una botella de cerveza de 12 oz (355 ml), un vaso de vino de 5 oz (148 ml) o un vaso de una bebida alcohlica de alta graduacin de 1 oz (44 ml). Estilo de vida  Haga ejercicio con regularidad. Trate de hacer un total de 150 minutos de actividad fsica por semana. Aumente la cantidad de ejercicio fsico que realiza mediante actividades como la jardinera, salir a caminar o usar las escaleras. No consuma ningn producto que contenga nicotina o tabaco. Estos productos incluyen cigarrillos, tabaco para mascar y aparatos de vapeo, como los cigarrillos electrnicos. Si necesita ayuda para dejar de consumir estos productos, consulte al mdico. Indicaciones generales Use los medicamentos de venta libre y los recetados solamente como se lo haya indicado el mdico. Concurra a todas las visitas de seguimiento. Esto es importante. Dnde buscar ms informacin American Heart Association (Asociacin Estadounidense del Corazn): www.heart.org National Heart, Lung, and Blood Institute (Instituto Nacional del Corazn, los Pulmones y la Sangre): www.nhlbi.nih.gov Comunquese con un mdico si: Tiene dificultad para alcanzar o mantener una alimentacin sana y un peso saludable. Est por comenzar un programa de ejercicios. No puede dejar de fumar. Solicite ayuda  de inmediato si: Siente dolor en el pecho. Tiene dificultad para respirar. Tiene molestias o dolor en la mandbula, el cuello, la espalda, los hombros o los brazos. Tiene sntomas de un accidente cerebrovascular. "BE FAST" es una manera fcil de recordar los principales signos de advertencia de un accidente cerebrovascular: B: Balance (equilibrio). Los signos son mareos, dificultad repentina para caminar o prdida del equilibrio. E - Eyes (ojos). Los signos son problemas para ver o un cambio repentino en la visin. F: Face (rostro). Los signos son debilidad repentina o entumecimiento del rostro, o el rostro o el prpado que se caen hacia un lado. A: Arms (brazos). Los signos son debilidad o entumecimiento en un brazo. Esto sucede de repente y generalmente en un lado del cuerpo. S: Speech (habla). Los signos son dificultad para hablar, hablar arrastrando las palabras o dificultad para comprender lo que las personas dicen. T: Time (tiempo). Es tiempo de llamar al servicio de emergencias. Anote la hora a la que comenzaron los sntomas. Presenta otros signos de un accidente cerebrovascular, como los siguientes: Dolor de cabeza sbito e intenso que no tiene causa aparente. Nuseas o vmitos. Convulsiones. Estos sntomas pueden representar un problema grave que constituye una emergencia. No espere a ver si los sntomas desaparecen. Solicite atencin mdica de inmediato. Comunquese con el servicio de emergencias de su localidad (911 en los [place]). No conduzca por sus propios medios hasta el hospital. Resumen Las placas de colesterol aumentan el riesgo de sufrir un infarto de miocardio y un accidente cerebrovascular. Trabaje con el mdico   para [employer] de colesterol en un rango saludable. Siga una dieta saludable y equilibrada, haga ejercicio con regularidad y Shan Levans un peso saludable. No consuma ningn producto que contenga nicotina o tabaco. Estos productos incluyen  cigarrillos, tabaco para Theatre manager y aparatos de vapeo, como los Administrator, Civil Service. Obtenga ayuda de inmediato si tiene cualquier sntoma de un accidente cerebrovascular. Esta informacin no tiene Theme park manager el consejo del mdico. Asegrese de hacerle al mdico cualquier pregunta que tenga. Document Revised: 09/27/2020 Document Reviewed: 09/27/2020 Elsevier Patient Education  2023 Elsevier Inc. Plan de alimentacin cardiosaludable Heart-Healthy Eating Plan Muchos factores influyen en la salud cardaca, entre ellos, los hbitos de alimentacin y de ejercicio fsico. La salud del corazn tambin se denomina salud coronaria. El riesgo coronario aumenta cuando hay niveles anormales de grasa (lpidos) en la [place]. Un plan de alimentacin cardiosaludable implica limitar las grasas poco saludables, aumentar las grasas saludables, limitar el consumo de sal (sodio) y hacer otros cambios en la dieta y el estilo de [place]. En qu consiste el plan? El mdico podra recomendarle que haga lo siguiente: Que limite la ingesta de grasas al ________ % o menos del total de caloras por da. Que limite la ingesta de grasas saturadas al _________ % o menos del total de caloras por da. Que limite la cantidad de colesterol en su dieta a menos de _________ mg por da. Que limite la cantidad de sodio en su dieta a menos de _________ mg por da. Cules son algunos consejos para seguir este plan? Al cocinar Evite frer los alimentos a la hora de la coccin. Hornear, hervir, grillar y asar a la parrilla son buenas opciones. Otras formas de reducir el consumo de grasas son las siguientes: Quite la piel de las aves. Quite todas las grasas visibles de las carnes. Cocine al vapor las verduras en agua o caldo. Planificacin de las comidas  En las comidas, imagine dividir su plato en cuartos: Llene la mitad del plato con verduras y ensaladas de hojas verdes. Llene un cuarto del plato con cereales  integrales. Llene un cuarto del plato con alimentos con protenas magras. Coma de 2 a 4 tazas de Copy. Una taza de verduras equivale a 1 taza (91 g) de brcoli o coliflor, 2 zanahorias medianas, 1 pimiento morrn grande, 1 batata grande, 1 tomate grande, 1 patata blanca mediana, 2 tazas (150 g) de verduras de [employer] crudas. Coma de 1 a 2 tazas de Radiation protection practitioner. Una taza de fruta equivale a 1 manzana pequea, 1 banana grande, 1 taza (237 g) de fruta mixta, 1 naranja grande,  taza (82 g) de fruta seca, 1 taza (240 ml) de jugo de fruta al 100 %. Consuma ms alimentos con fibra soluble. Entre ellos, se incluyen las [place], el [place], las [place], los frijoles, los guisantes y Aeronautical engineer. Trate de consumir de 25 a 30 g de [employer]. Aumente el consumo de legumbres, frutos secos y semillas a 4 o 5 porciones por semana. Una porcin de frijoles secos o legumbres equivale a 1?4 taza (90 g) cocinados, 1 porcin de frutos secos equivale a  oz (12 almendras, 24 pistachos o 7 mitades de nuez) y 1 porcin de semillas equivale a  oz (8 g). Grasas Elija grasas saludables con mayor frecuencia. Elija las grasas monoinsaturadas y [address], como el aceite de oliva y canola, el aceite de [place], la semillas de [place], las nueces, las almendras y las semillas. Consuma ms grasas omega-3. Elija  salmn, caballa, sardinas, atn, aceite de lino y semillas de lino molidas. Propngase comer pescado al [employer] veces por semana. Lea las etiquetas de los alimentos detenidamente para identificar los que contienen grasas trans o altas cantidades de grasas saturadas. Limite el consumo de grasas saturadas. Estas se encuentran en productos de origen animal, como carnes, mantequilla y crema. Las grasas saturadas de origen vegetal incluyen aceite de palma, de palmiste y de coco. Evite los alimentos con aceites parcialmente hidrogenados. Estos contienen grasas trans. [address] [place], se incluyen  margarinas en barra, algunas margarinas untables, galletas dulces y [place] y otros productos horneados. Evite las comidas fritas. Informacin general Consuma ms comida casera y menos de restaurante, de bares y comida rpida. Limite o evite el alcohol. Limite los alimentos con alto contenido de International aid/development worker aadido y almidones simples, como los alimentos elaborados con harina blanca refinada (pan blanco, pasteles, dulces). Baje de peso si es necesario. Perder solo del 5 al 10 % de su peso corporal puede ayudarlo a mejorar su estado de salud general y a Education officer, museum, como la diabetes y las enfermedades cardacas. Controle la ingesta de sodio, especialmente si tiene presin arterial alta. Hable con el mdico acerca de cambiar la ingesta de sodio. Intente incorporar ms comidas vegetarianas cada semana. Qu alimentos debo comer? Nils Pyle Nils Pyle frescas, en conserva (en su jugo natural) o frutas congeladas. Verduras Verduras frescas o congeladas (crudas, al vapor, asadas o grilladas). Ensaladas de hojas verdes. Cereales La mayora de los cereales. Elija casi siempre trigo integral y Radiation protection practitioner. Arroz y pastas, incluido el arroz integral y las pastas elaboradas con trigo integral. Armed forces operational officer y otras protenas [place] magras de res, ternera, cerdo y cordero a las que se les haya quitado la grasa visible. Pollo y pavo sin piel. Todos los pescados y [employer]. Pato salvaje, conejo, faisn y venado. Claras de huevo o sustitutos del huevo bajos en colesterol. Porotos, guisantes y lentejas secos y tofu. Semillas y la mayora de los frutos secos. Lcteos Quesos descremados y semidescremados, entre ellos, ricota y Garment/textile technologist. Leche descremada o al 1 % (lquida, en polvo o evaporada). Suero de [employer] elaborado con [employer]. Yogur descremado o semidescremado. Grasas y Writer no hidrogenadas (sin grasas trans). Aceites vegetales, incluido el de soja, ssamo, girasol, [place],  [place], man, crtamo, maz, canola y semillas de algodn. Alios para ensalada o mayonesa elaborados con aceite vegetal. Halina Andreas (mineral o con gas). T y caf. T helado sin endulzar. Bebidas dietticas. Dulces y [employer], gelatina y helado de frutas. Pequeas cantidades de chocolate amargo. Limite todos los dulces y postres. Alios y [employer] y condimentos. Es posible que los productos que se enumeran ms [place] no constituyan una lista completa de los alimentos y las bebidas que puede tomar. Consulte a un nutricionista para conocer ms opciones. Qu alimentos debo evitar? Nils Pyle Fruta enlatada en almbar espeso. Frutas con salsa de crema o mantequilla. Frutas fritas. Limite el consumo de coco. Verduras Verduras cocinadas con salsas de queso, crema o mantequilla. Verduras fritas. Cereales Panes elaborados con grasas saturadas o trans, aceites o [employer]. Croissants. Panecillos dulces. [place]. Galletas con alto contenido de grasas, como las que galletas de queso y las papas fritas. Carnes y [address] protenas [address] grasas, como perros calientes, [place] de res, salchichas, tocino, asado de Producer, television/film/video o [place]. Fiambres con alto contenido de [place], como salame y Loss adjuster, chartered. Caviar. Pato y ganso domsticos. Vsceras, como hgado. Lcteos Crema, crema Frisbee, queso crema y [place] cottage  con crema. Quesos enteros. Leche entera o al 2 % (lquida, evaporada o condensada). Suero de [employer]. Salsa de crema o queso con alto contenido de [place]. Yogur de [employer]. Grasas y [place] de carne o materia grasa. Manteca de cacao, aceites hidrogenados, aceite de palma, aceite de coco, aceite de palmiste. Grasas y [address] grasas slidas, incluida la grasa del tocino, el cerdo [place], la [place] de cerdo y Civil engineer, contracting. Sustitutos no lcteos de la crema. Aderezos para ensalada con queso o crema agria. Bebidas Refrescos regulares y cualquier bebida  con agregado de azcar. Dulces y [employer]. Pudin. Galletas dulces. Bizcochuelos. Pasteles. Chocolate con leche o chocolate blanco. Almbares con mantequilla. Helados o bebidas elaboradas con helado con alto contenido de grasas. Es posible que los productos que se enumeran ms arriba no sean una lista completa de los alimentos y las bebidas que se Theatre stage manager. Consulte a un nutricionista para obtener ms informacin. Resumen La planificacin de comidas cardiosaludables implica limitar las grasas poco saludables, aumentar las grasas saludables, limitar el consumo de sal (sodio) y Radio producer otros cambios en la dieta y el estilo de [place]. Baje de peso si es necesario. Perder solo del 5 al 10 % de su peso corporal puede ayudarlo a mejorar su estado de salud general y a Education officer, museum, como la diabetes y las enfermedades cardacas. Propngase seguir una dieta equilibrada, que incluya frutas y verduras, productos lcteos descremados o semidescremados, protenas magras, frutos secos y legumbres, cereales integrales y aceites y grasas cardiosaludables. Esta informacin no tiene Theme park manager el consejo del mdico. Asegrese de hacerle al mdico cualquier pregunta que tenga. Document Revised: 08/31/2021 Document Reviewed: 08/31/2021 Elsevier Patient Education  2023 [employer].

## 2022-03-02 NOTE — Progress Notes (Signed)
Established Patient Office Visit  Subjective   Patient ID: Jordan Mccarthy, female    DOB: [birth year]  Age: 36 y.o. MRN: [id number]  Chief Complaint  Patient presents with   Follow-up    Labs drawn 02/02/22    HPI  Jordan Mccarthy is a 36 year old female with no significant medical history presents for routine follow up and lab review.  Her labs done on 02/02/2022 with unremarkable except for LDL was 186 mg per DL and total cholesterol was 259 mg per DL.  She denies chest pain, palpitation, shortness of breath and lightheadedness.  Overall, she states that she is doing well and offers no further complaint.  Review of Systems  Constitutional: Negative.   Eyes: Negative.   Respiratory: Negative.    Cardiovascular: Negative.   Skin: Negative.   Neurological: Negative.   Psychiatric/Behavioral: Negative.        Objective:     BP 106/70 (BP Location: Left Arm, Patient Position: Sitting, Cuff Size: Large)   Pulse 71   Temp 97.9 F (36.6 C) (Oral)   Resp 16   Ht _0  (1.575 m)   Wt 160 lb 12.8 oz (72.9 kg)   LMP 02/01/2022 (Exact Date)   SpO2 98%   BMI 29.41 kg/m  BP Readings from Last 3 Encounters:  03/02/22 106/70  02/02/22 100/68  11/08/21 106/74   Wt Readings from Last 3 Encounters:  03/02/22 160 lb 12.8 oz (72.9 kg)  02/02/22 160 lb 4.8 oz (72.7 kg)  11/08/21 165 lb 12.8 oz (75.2 kg)      Physical Exam HENT:     Head: Normocephalic and atraumatic.     Mouth/Throat:     Mouth: Mucous membranes are moist.  Eyes:     Extraocular Movements: Extraocular movements intact.     Conjunctiva/sclera: Conjunctivae normal.     Pupils: Pupils are equal, round, and reactive to light.  Cardiovascular:     Rate and Rhythm: Normal rate and regular rhythm.     Pulses: Normal pulses.     Heart sounds: Normal heart sounds.  Pulmonary:     Effort: Pulmonary effort is normal.     Breath sounds: Normal breath sounds.  Skin:    General: Skin is warm.  Neurological:      General: No focal deficit present.     Mental Status: She is alert and oriented to person, place, and time. Mental status is at baseline.  Psychiatric:        Mood and Affect: Mood normal.        Behavior: Behavior normal.        Thought Content: Thought content normal.        Judgment: Judgment normal.      No results found for any visits on 03/02/22.  Last CBC Lab Results  Component Value Date   WBC 7.5 02/02/2022   HGB 12.2 02/02/2022   HCT 36.7 02/02/2022   MCV 88 02/02/2022   MCH 29.1 02/02/2022   RDW 13.1 02/02/2022   PLT 369 65/68/1275   Last metabolic panel Lab Results  Component Value Date   GLUCOSE 84 02/02/2022   NA 140 02/02/2022   K 4.2 02/02/2022   CL 104 02/02/2022   CO2 24 02/02/2022   BUN 11 02/02/2022   CREATININE 0.69 02/02/2022   EGFR 117 02/02/2022   CALCIUM 9.1 02/02/2022   PROT 7.3 02/02/2022   ALBUMIN 4.3 02/02/2022   LABGLOB 3.0 02/02/2022   AGRATIO 1.4 02/02/2022  BILITOT 0.3 02/02/2022   ALKPHOS 59 02/02/2022   AST 15 02/02/2022   ALT 14 02/02/2022   ANIONGAP 9 09/13/2020   Last lipids Lab Results  Component Value Date   CHOL 259 (H) 02/02/2022   HDL 56 02/02/2022   LDLCALC 186 (H) 02/02/2022   TRIG 97 02/02/2022   CHOLHDL 4.6 (H) 02/02/2022   Last hemoglobin A1c Lab Results  Component Value Date   HGBA1C 5.2 02/02/2022      The ASCVD Risk score (Arnett DK, et al., 2019) failed to calculate for the following reasons:   The 2019 ASCVD risk score is only valid for ages 59 to 44    Assessment & Plan:   1. Elevated lipids -Her LDL was 186 mg per DL, she was advised to continue on low fat/cholesterol diet and exercise as tolerated.  Will recheck lipid panel in 3 months. - Lipid panel; Future   Return in about 14 weeks (around 06/08/2022), or if symptoms worsen or fail to improve.    Lanisa Ishler Jerold Coombe, NP

## 2022-04-25 ENCOUNTER — Ambulatory Visit (LOCAL_COMMUNITY_HEALTH_CENTER): Payer: Self-pay

## 2022-04-25 VITALS — BP 108/60 | Ht 67.0 in | Wt 158.0 lb

## 2022-04-25 DIAGNOSIS — Z30011 Encounter for initial prescription of contraceptive pills: Secondary | ICD-10-CM

## 2022-04-25 DIAGNOSIS — Z3009 Encounter for other general counseling and advice on contraception: Secondary | ICD-10-CM

## 2022-04-25 DIAGNOSIS — Z3041 Encounter for surveillance of contraceptive pills: Secondary | ICD-10-CM

## 2022-04-25 MED ORDER — NORGESTIM-ETH ESTRAD TRIPHASIC 0.18/0.215/0.25 MG-25 MCG PO TABS: 1.0000 | ORAL_TABLET | Freq: Every day | ORAL | 0 refills | Status: DC

## 2022-04-25 NOTE — Progress Notes (Signed)
In nurse clinic for ocp refill. Pt explains she has one week remaining in last pack of ocp. Denies missing any pills and takes same time daily.   Pt is due for #6 packs Norgestimate-ethinly estradiol Triphasic per order by Hilario Quarry, FNP dated 11/08/2021.   The patient was dispensed Norgestimate-ethinyl Estradiol Triphasic  # 6 packs today. I provided counseling today regarding the medication. We discussed the medication, the side effects and when to call clinic. Patient given the opportunity to ask questions. Questions answered.   Pt due for annual PE 11/09/2021, has reminder. Josie Saunders, RN

## 2022-05-31 ENCOUNTER — Other Ambulatory Visit: Payer: Self-pay

## 2022-05-31 DIAGNOSIS — E785 Hyperlipidemia, unspecified: Secondary | ICD-10-CM

## 2022-06-01 LAB — LIPID PANEL
Chol/HDL Ratio: 4.4 ratio (ref 0.0–4.4)
Cholesterol, Total: 237 mg/dL — ABNORMAL HIGH (ref 100–199)
HDL: 54 mg/dL (ref >39)
LDL Chol Calc (NIH): 164 mg/dL — ABNORMAL HIGH (ref 0–99)
Triglycerides: 106 mg/dL (ref 0–149)
VLDL Cholesterol Cal: 19 mg/dL (ref 5–40)

## 2022-06-07 ENCOUNTER — Ambulatory Visit: Payer: Self-pay | Admitting: Gerontology

## 2022-06-13 ENCOUNTER — Encounter: Payer: Self-pay | Admitting: Gerontology

## 2022-06-13 ENCOUNTER — Ambulatory Visit: Payer: Self-pay | Admitting: Gerontology

## 2022-06-13 ENCOUNTER — Other Ambulatory Visit: Payer: Self-pay

## 2022-06-13 VITALS — BP 103/68 | HR 64 | Temp 98.0°F | Resp 16 | Ht 62.0 in | Wt 157.1 lb

## 2022-06-13 DIAGNOSIS — E785 Hyperlipidemia, unspecified: Secondary | ICD-10-CM

## 2022-06-13 NOTE — Progress Notes (Signed)
Established Patient Office Visit  Subjective   Patient ID: Jordan Mccarthy, female    DOB: [birth year]  Age: 36 y.o. MRN: [id number]  Chief Complaint  Patient presents with   Follow-up   Hyperlipidemia    Labs drawn 05/31/22    HPI  Jordan Mccarthy is a 36 year old female with no significant medical history presents for routine follow up and lab review.  Her labs done on 05/31/22, LDL decreased from 186 mg/dl to 164 mg/dl and Total cholesterol decreased from 259 mg/dl to 237 mg/dl. She states that she has modified her diet and making healthy lifestyle changes. She denies chest pain, palpitation, shortness of breath and offers no further complaint.  Review of Systems  Constitutional: Negative.   Respiratory: Negative.    Cardiovascular: Negative.   Neurological: Negative.   Psychiatric/Behavioral: Negative.        Objective:     BP 103/68 (BP Location: Right Arm, Patient Position: Sitting, Cuff Size: Normal)   Pulse 64   Temp 98 F (36.7 C) (Oral)   Resp 16   Ht _0  (1.575 m)   Wt 157 lb 1.6 oz (71.3 kg)   LMP 06/01/2022 (Exact Date)   SpO2 98%   BMI 28.73 kg/m  BP Readings from Last 3 Encounters:  06/13/22 103/68  05/31/22 111/74  04/25/22 108/60   Wt Readings from Last 3 Encounters:  06/13/22 157 lb 1.6 oz (71.3 kg)  05/31/22 155 lb 14.4 oz (70.7 kg)  04/25/22 158 lb (71.7 kg)      Physical Exam HENT:     Head: Normocephalic.     Mouth/Throat:     Mouth: Mucous membranes are moist.  Eyes:     Pupils: Pupils are equal, round, and reactive to light.  Cardiovascular:     Rate and Rhythm: Normal rate and regular rhythm.     Pulses: Normal pulses.     Heart sounds: Normal heart sounds.  Pulmonary:     Effort: Pulmonary effort is normal.     Breath sounds: Normal breath sounds.  Skin:    General: Skin is warm.  Neurological:     General: No focal deficit present.     Mental Status: She is alert.  Psychiatric:        Mood and Affect: Mood normal.       No results found for any visits on 06/13/22.  Last CBC Lab Results  Component Value Date   WBC 7.5 02/02/2022   HGB 12.2 02/02/2022   HCT 36.7 02/02/2022   MCV 88 02/02/2022   MCH 29.1 02/02/2022   RDW 13.1 02/02/2022   PLT 369 54/62/7035   Last metabolic panel Lab Results  Component Value Date   GLUCOSE 84 02/02/2022   NA 140 02/02/2022   K 4.2 02/02/2022   CL 104 02/02/2022   CO2 24 02/02/2022   BUN 11 02/02/2022   CREATININE 0.69 02/02/2022   EGFR 117 02/02/2022   CALCIUM 9.1 02/02/2022   PROT 7.3 02/02/2022   ALBUMIN 4.3 02/02/2022   LABGLOB 3.0 02/02/2022   AGRATIO 1.4 02/02/2022   BILITOT 0.3 02/02/2022   ALKPHOS 59 02/02/2022   AST 15 02/02/2022   ALT 14 02/02/2022   ANIONGAP 9 09/13/2020   Last lipids Lab Results  Component Value Date   CHOL 237 (H) 05/31/2022   HDL 54 05/31/2022   LDLCALC 164 (H) 05/31/2022   TRIG 106 05/31/2022   CHOLHDL 4.4 05/31/2022   Last hemoglobin A1c Lab Results  Component Value Date   HGBA1C 5.2 02/02/2022   Last thyroid functions No results found for: "TSH", "T3TOTAL", "T4TOTAL", "THYROIDAB" Last vitamin D No results found for: "25OHVITD2", "25OHVITD3", "VD25OH"    The ASCVD Risk score (Arnett DK, et al., 2019) failed to calculate for the following reasons:   The 2019 ASCVD risk score is only valid for ages 23 to 62    Assessment & Plan:   1. Elevated lipids - She was advised to continue making healthy lifestyle changes , low fat/cholesterol diet and exercise as tolerated. Will recheck Lipid panel in 3 months. - Lipid panel; Future   Return in about 16 weeks (around 10/03/2022), or if symptoms worsen or fail to improve.    Trayvon Trumbull Jerold Coombe, NP

## 2022-06-13 NOTE — Patient Instructions (Signed)
Plan de alimentacin cardiosaludable Heart-Healthy Eating Plan Muchos factores influyen en la salud cardaca, entre ellos, los hbitos de alimentacin y de ejercicio fsico. La salud del corazn tambin se denomina salud coronaria. El riesgo coronario aumenta cuando hay niveles anormales de grasa (lpidos) en la sangre. Un plan de alimentacin cardiosaludable implica limitar las grasas poco saludables, aumentar las grasas saludables, limitar el consumo de sal (sodio) y hacer otros cambios en la dieta y el estilo de vida. En qu consiste el plan? El mdico podra recomendarle que haga lo siguiente: Que limite la ingesta de grasas al ________ % o menos del total de caloras por da. Que limite la ingesta de grasas saturadas al _________ % o menos del total de caloras por da. Que limite la cantidad de colesterol en su dieta a menos de _________ mg por da. Que limite la cantidad de sodio en su dieta a menos de _________ mg por da. Cules son algunos consejos para seguir este plan? Al cocinar Evite frer los alimentos a la hora de la coccin. Hornear, hervir, grillar y asar a la parrilla son buenas opciones. Otras formas de reducir el consumo de grasas son las siguientes: Quite la piel de las aves. Quite todas las grasas visibles de las carnes. Cocine al vapor las verduras en agua o caldo. Planificacin de las comidas  En las comidas, imagine dividir su plato en cuartos: Llene la mitad del plato con verduras y ensaladas de hojas verdes. Llene un cuarto del plato con cereales integrales. Llene un cuarto del plato con alimentos con protenas magras. Coma de 2 a 4 tazas de verduras cada da. Una taza de verduras equivale a 1 taza (91 g) de brcoli o coliflor, 2 zanahorias medianas, 1 pimiento morrn grande, 1 batata grande, 1 tomate grande, 1 patata blanca mediana, 2 tazas (150 g) de verduras de hoja verde crudas. Coma de 1 a 2 tazas de fruta cada da. Una taza de fruta equivale a 1 manzana  pequea, 1 banana grande, 1 taza (237 g) de fruta mixta, 1 naranja grande,  taza (82 g) de fruta seca, 1 taza (240 ml) de jugo de fruta al 100 %. Consuma ms alimentos con fibra soluble. Entre ellos, se incluyen las manzanas, el brcoli, las zanahorias, los frijoles, los guisantes y la cebada. Trate de consumir de 25 a 30 g de fibra por da. Aumente el consumo de legumbres, frutos secos y semillas a 4 o 5 porciones por semana. Una porcin de frijoles secos o legumbres equivale a 1?4 taza (90 g) cocinados, 1 porcin de frutos secos equivale a  oz (12 almendras, 24 pistachos o 7 mitades de nuez) y 1 porcin de semillas equivale a  oz (8 g). Grasas Elija grasas saludables con mayor frecuencia. Elija las grasas monoinsaturadas y poliinsaturadas, como el aceite de oliva y canola, el aceite de aguacate, la semillas de lino, las nueces, las almendras y las semillas. Consuma ms grasas omega-3. Elija salmn, caballa, sardinas, atn, aceite de lino y semillas de lino molidas. Propngase comer pescado al menos dos veces por semana. Lea las etiquetas de los alimentos detenidamente para identificar los que contienen grasas trans o altas cantidades de grasas saturadas. Limite el consumo de grasas saturadas. Estas se encuentran en productos de origen animal, como carnes, mantequilla y crema. Las grasas saturadas de origen vegetal incluyen aceite de palma, de palmiste y de coco. Evite los alimentos con aceites parcialmente hidrogenados. Estos contienen grasas trans. Entre los ejemplos, se incluyen margarinas en barra, algunas   margarinas untables, galletas dulces y saladas y otros productos horneados. Evite las comidas fritas. Informacin general Consuma ms comida casera y menos de restaurante, de bares y comida rpida. Limite o evite el alcohol. Limite los alimentos con alto contenido de azcar aadido y almidones simples, como los alimentos elaborados con harina blanca refinada (pan blanco, pasteles,  dulces). Baje de peso si es necesario. Perder solo del 5 al 10 % de su peso corporal puede ayudarlo a mejorar su estado de salud general y a prevenir enfermedades, como la diabetes y las enfermedades cardacas. Controle la ingesta de sodio, especialmente si tiene presin arterial alta. Hable con el mdico acerca de cambiar la ingesta de sodio. Intente incorporar ms comidas vegetarianas cada semana. Qu alimentos debo comer? Frutas Frutas frescas, en conserva (en su jugo natural) o frutas congeladas. Verduras Verduras frescas o congeladas (crudas, al vapor, asadas o grilladas). Ensaladas de hojas verdes. Cereales La mayora de los cereales. Elija casi siempre trigo integral y cereales integrales. Arroz y pastas, incluido el arroz integral y las pastas elaboradas con trigo integral. Carnes y otras protenas Carnes magras de res, ternera, cerdo y cordero a las que se les haya quitado la grasa visible. Pollo y pavo sin piel. Todos los pescados y mariscos. Pato salvaje, conejo, faisn y venado. Claras de huevo o sustitutos del huevo bajos en colesterol. Porotos, guisantes y lentejas secos y tofu. Semillas y la mayora de los frutos secos. Lcteos Quesos descremados y semidescremados, entre ellos, ricota y mozzarella. Leche descremada o al 1 % (lquida, en polvo o evaporada). Suero de leche elaborado con leche semidescremada. Yogur descremado o semidescremado. Grasas y aceites Margarinas no hidrogenadas (sin grasas trans). Aceites vegetales, incluido el de soja, ssamo, girasol, oliva, aguacate, man, crtamo, maz, canola y semillas de algodn. Alios para ensalada o mayonesa elaborados con aceite vegetal. Bebidas Agua (mineral o con gas). T y caf. T helado sin endulzar. Bebidas dietticas. Dulces y postres Sorbete, gelatina y helado de frutas. Pequeas cantidades de chocolate amargo. Limite todos los dulces y postres. Alios y condimentos Todos los alios y condimentos. Es posible que los  productos que se enumeran ms arriba no constituyan una lista completa de los alimentos y las bebidas que puede tomar. Consulte a un nutricionista para conocer ms opciones. Qu alimentos debo evitar? Frutas Fruta enlatada en almbar espeso. Frutas con salsa de crema o mantequilla. Frutas fritas. Limite el consumo de coco. Verduras Verduras cocinadas con salsas de queso, crema o mantequilla. Verduras fritas. Cereales Panes elaborados con grasas saturadas o trans, aceites o leche entera. Croissants. Panecillos dulces. Rosquillas. Galletas con alto contenido de grasas, como las que galletas de queso y las papas fritas. Carnes y otras protenas Carnes grasas, como perros calientes, costillas de res, salchichas, tocino, asado de costillar o chuletn. Fiambres con alto contenido de grasas, como salame y mortadela. Caviar. Pato y ganso domsticos. Vsceras, como hgado. Lcteos Crema, crema agria, queso crema y queso cottage con crema. Quesos enteros. Leche entera o al 2 % (lquida, evaporada o condensada). Suero de leche entero. Salsa de crema o queso con alto contenido de grasas. Yogur de leche entera. Grasas y aceites Grasa de carne o materia grasa. Manteca de cacao, aceites hidrogenados, aceite de palma, aceite de coco, aceite de palmiste. Grasas y materias grasas slidas, incluida la grasa del tocino, el cerdo salado, la manteca de cerdo y la mantequilla. Sustitutos no lcteos de la crema. Aderezos para ensalada con queso o crema agria. Bebidas Refrescos regulares y cualquier bebida   con agregado de azcar. Dulces y postres Glaseados. Pudin. Galletas dulces. Bizcochuelos. Pasteles. Chocolate con leche o chocolate blanco. Almbares con mantequilla. Helados o bebidas elaboradas con helado con alto contenido de grasas. Es posible que los productos que se enumeran ms arriba no sean una lista completa de los alimentos y las bebidas que se deben evitar. Consulte a un nutricionista para obtener ms  informacin. Resumen La planificacin de comidas cardiosaludables implica limitar las grasas poco saludables, aumentar las grasas saludables, limitar el consumo de sal (sodio) y hacer otros cambios en la dieta y el estilo de vida. Baje de peso si es necesario. Perder solo del 5 al 10 % de su peso corporal puede ayudarlo a mejorar su estado de salud general y a prevenir enfermedades, como la diabetes y las enfermedades cardacas. Propngase seguir una dieta equilibrada, que incluya frutas y verduras, productos lcteos descremados o semidescremados, protenas magras, frutos secos y legumbres, cereales integrales y aceites y grasas cardiosaludables. Esta informacin no tiene como fin reemplazar el consejo del mdico. Asegrese de hacerle al mdico cualquier pregunta que tenga. Document Revised: 08/31/2021 Document Reviewed: 08/31/2021 Elsevier Patient Education  2023 [employer].  

## 2022-06-13 NOTE — Progress Notes (Signed)
[employer] language services used for today's visit, id# [id number].

## 2022-09-27 ENCOUNTER — Other Ambulatory Visit: Payer: Self-pay

## 2022-09-27 DIAGNOSIS — E785 Hyperlipidemia, unspecified: Secondary | ICD-10-CM

## 2022-09-28 LAB — LIPID PANEL
Chol/HDL Ratio: 4.4 ratio (ref 0.0–4.4)
Cholesterol, Total: 243 mg/dL — ABNORMAL HIGH (ref 100–199)
HDL: 55 mg/dL (ref >39)
LDL Chol Calc (NIH): 176 mg/dL — ABNORMAL HIGH (ref 0–99)
Triglycerides: 69 mg/dL (ref 0–149)
VLDL Cholesterol Cal: 12 mg/dL (ref 5–40)

## 2022-10-03 ENCOUNTER — Ambulatory Visit: Payer: Self-pay | Admitting: Gerontology

## 2022-10-04 ENCOUNTER — Ambulatory Visit (LOCAL_COMMUNITY_HEALTH_CENTER): Payer: Self-pay

## 2022-10-04 VITALS — BP 106/76 | Ht 67.0 in | Wt 156.5 lb

## 2022-10-04 DIAGNOSIS — Z3009 Encounter for other general counseling and advice on contraception: Secondary | ICD-10-CM

## 2022-10-04 DIAGNOSIS — Z3041 Encounter for surveillance of contraceptive pills: Secondary | ICD-10-CM

## 2022-10-04 MED ORDER — NORGESTIM-ETH ESTRAD TRIPHASIC 0.18/0.215/0.25 MG-35 MCG PO TABS: 1.0000 | ORAL_TABLET | Freq: Every day | ORAL | 0 refills | Status: DC

## 2022-10-04 NOTE — Progress Notes (Signed)
In nurse clinic requesting ocp refill. Patient denies missing any ocp and is taking pill same time daily. States she has one week left in her current pack before she runs out.   Per Epic patient has been dispensed #12 packs norgestimate ethinyl estradiol triphasic per Hilario Quarry, FNP order dated 11/08/2021. Patient is due #1 pack to complete this order.  This remaining #1 pack was dispensed today to patient.   Consult Art Buff, FNP who agrees to order #1 additional pack of ocp (norgestimate ethinyl estradiol triphasic) so that patient will not run out of ocp before annual PE.   The patient was dispensed Norgestimate Ethinyl Estradiol Triphasic #2 packs today (#1 pack to complete Hilario Quarry order on 11/08/2021 and #1 pack per VO Art Buff, FNP today.)  I provided counseling today regarding the medication. We discussed the medication, the side effects and when to call clinic. Patient given the opportunity to ask questions. Questions answered.    Annual PE scheduled 11/10/2022 at 10:30am. Has reminder. Lang line [phone].  Josie Saunders, RN

## 2022-10-18 ENCOUNTER — Other Ambulatory Visit: Payer: Self-pay

## 2022-10-18 ENCOUNTER — Ambulatory Visit: Payer: Self-pay | Admitting: Gerontology

## 2022-10-18 ENCOUNTER — Encounter: Payer: Self-pay | Admitting: Gerontology

## 2022-10-18 VITALS — BP 118/84 | HR 83 | Temp 98.0°F | Resp 16 | Ht 62.0 in | Wt 159.2 lb

## 2022-10-18 DIAGNOSIS — H579 Unspecified disorder of eye and adnexa: Secondary | ICD-10-CM | POA: Insufficient documentation

## 2022-10-18 DIAGNOSIS — E785 Hyperlipidemia, unspecified: Secondary | ICD-10-CM

## 2022-10-18 MED ORDER — PRAVASTATIN SODIUM 10 MG PO TABS
10.0000 mg | ORAL_TABLET | Freq: Every day | ORAL | 0 refills | Status: DC
  Filled 2022-10-18: qty 30, 30d supply, fill #0

## 2022-10-18 NOTE — Progress Notes (Signed)
Established Patient Office Visit  Subjective   Patient ID: Jordan Mccarthy, female    DOB: August 13, 1985  Age: 37 y.o. MRN: [id number]  Chief Complaint  Patient presents with   Follow-up   Hyperlipidemia    Labs drawn 09/27/22    HPI Jordan Mccarthy is a 37 year old female with no significant medical history who presents for routine follow up and lab review. Her total cholesterol has increased from 237 to 243, triglycerides have decreased from 106 to 69, HDL increased from 54 to 55, and LDL increased from 164 to 176. She states that she does not get any exercise and she does not follow a specific diet. She cooks most of her meals, but does eat fast food about once per week. She states that she tries to cook with minimal olive oil. She would like to have an eye exam because she has never had her eyes checked. Occasionally she states that she "sees stars". She does have some pain and itching in both eyes during these episodes. This has been occurring for about two months and happens about twice per week. She does not recall that anything makes it occur, and she closes her eyes, flushes her eyes out with water, and takes Tylenol to help with this. She endorses some congestion and shortness of breath today, but appears to be in no acute distress. These symptoms started yesterday after she came inside from being outside. Overall, she is doing well and offers no further complaints.  Review of Systems  Constitutional: Negative.   HENT:  Positive for congestion.   Eyes:        "Seeing stars"  Respiratory:  Positive for shortness of breath.   Cardiovascular: Negative.   Gastrointestinal: Negative.   Genitourinary: Negative.   Musculoskeletal: Negative.   Skin: Negative.   Neurological: Negative.   Endo/Heme/Allergies: Negative.   Psychiatric/Behavioral: Negative.        Objective:     BP 118/84 (BP Location: Left Arm, Patient Position: Sitting, Cuff Size: Normal)   Pulse 83   Temp 98 F  (36.7 C) (Oral)   Resp 16   Ht 5\' 2"  (1.575 m)   Wt 159 lb 3.2 oz (72.2 kg)   LMP 10/14/2022 (Exact Date)   SpO2 98%   BMI 29.12 kg/m  BP Readings from Last 3 Encounters:  10/18/22 118/84  10/04/22 106/76  09/27/22 110/73   Wt Readings from Last 3 Encounters:  10/18/22 159 lb 3.2 oz (72.2 kg)  10/04/22 156 lb 8 oz (71 kg)  09/27/22 155 lb 6.4 oz (70.5 kg)      Physical Exam Constitutional:      Appearance: Normal appearance. She is normal weight.  HENT:     Head: Normocephalic.     Right Ear: Tympanic membrane normal.     Left Ear: Tympanic membrane normal.     Nose: Nose normal.     Mouth/Throat:     Mouth: Mucous membranes are moist.     Pharynx: Oropharynx is clear.  Eyes:     Pupils: Pupils are equal, round, and reactive to light.  Cardiovascular:     Rate and Rhythm: Normal rate and regular rhythm.     Pulses: Normal pulses.     Heart sounds: Normal heart sounds.  Pulmonary:     Effort: Pulmonary effort is normal.     Breath sounds: Normal breath sounds.  Abdominal:     General: Abdomen is flat. Bowel sounds are normal.  Musculoskeletal:  General: Normal range of motion.     Cervical back: Normal range of motion.  Skin:    General: Skin is warm and dry.     Capillary Refill: Capillary refill takes less than 2 seconds.  Neurological:     General: No focal deficit present.     Mental Status: She is alert and oriented to person, place, and time. Mental status is at baseline.  Psychiatric:        Mood and Affect: Mood normal.        Behavior: Behavior normal.        Thought Content: Thought content normal.        Judgment: Judgment normal.     Last CBC Lab Results  Component Value Date   WBC 7.5 02/02/2022   HGB 12.2 02/02/2022   HCT 36.7 02/02/2022   MCV 88 02/02/2022   MCH 29.1 02/02/2022   RDW 13.1 02/02/2022   PLT 369 [id number]   Last metabolic panel Lab Results  Component Value Date   GLUCOSE 84 02/02/2022   NA 140 02/02/2022    K 4.2 02/02/2022   CL 104 02/02/2022   CO2 24 02/02/2022   BUN 11 02/02/2022   CREATININE 0.69 02/02/2022   EGFR 117 02/02/2022   CALCIUM 9.1 02/02/2022   PROT 7.3 02/02/2022   ALBUMIN 4.3 02/02/2022   LABGLOB 3.0 02/02/2022   AGRATIO 1.4 02/02/2022   BILITOT 0.3 02/02/2022   ALKPHOS 59 02/02/2022   AST 15 02/02/2022   ALT 14 02/02/2022   ANIONGAP 9 09/13/2020   Last lipids Lab Results  Component Value Date   CHOL 243 (H) 09/27/2022   HDL 55 09/27/2022   LDLCALC 176 (H) 09/27/2022   TRIG 69 09/27/2022   CHOLHDL 4.4 09/27/2022   Last hemoglobin A1c Lab Results  Component Value Date   HGBA1C 5.2 02/02/2022   Last thyroid functions No results found for: "TSH", "T3TOTAL", "T4TOTAL", "THYROIDAB" Last vitamin D No results found for: "25OHVITD2", "25OHVITD3", "VD25OH" Last vitamin B12 and Folate No results found for: "VITAMINB12", "FOLATE"    The ASCVD Risk score (Arnett DK, et al., 2019) failed to calculate for the following reasons:   The 2019 ASCVD risk score is only valid for ages 58 to 53    Assessment & Plan:  1. Elevated lipids - She will be started on Pravastatin today. She was educated about the medication and instructed to call the clinic if she has any issues. - She was encouraged to avoid fatty foods and to exercise as tolerated. - pravastatin (PRAVACHOL) 10 MG tablet; Take 1 tablet (10 mg total) by mouth daily.  Dispense: 30 tablet; Refill: 0  2. Itchy eyes - She was advised to use OTC eye drops/antihistamines as needed. - She was scheduled for an eye exam in April.   She will follow up in the clinic in one month, 11/15/22, or sooner if she worsens.   Eloise Harman, FNP Student

## 2022-10-18 NOTE — Patient Instructions (Addendum)
Alergias en los adultos Allergies, Adult Obie Dredge es cuando el organismo reacciona ante algo que lo molesta (alrgeno). Las Lawyer la nariz, los ojos, la piel y [place]. No se pueden transmitir de [place] persona a [place]. Las Medtronic pueden comenzar a Hotel manager. A veces, desaparecen a medida que pasa el tiempo. Cules son las causas? Cosas que estn en el exterior, como el polen, el humo de los automviles y Building services engineer. Cosas que estn en el interior, como el polvo, el humo, el moho y [employer] Corporation. Alimentos. Medicamentos. Cosas que provocan molestias en la piel. Estas incluyen perfumes y picaduras de insectos. Qu incrementa el riesgo? Tener familiares con alergias o asma. Cules son los signos o sntomas? Las Celanese Corporation causar lo siguiente: Secrecin nasal, congestin nasal o estornudos. Picazn en la boca, los odos o la garganta. Sensacin de mucosidad que gotea por la parte posterior de la garganta. Dolor de Investment banker, operational. Ojos rojos, [place], hinchados o con picazn. Zonas de la piel hinchadas, enrojecidas o con erupcin (ronchas). Clicos estomacales o meteorismo. Si tiene Restaurant manager, fast food grave (reaccin anafilctica) a alimentos, medicamentos o picaduras de insectos, tambin puede tener: Rostro enrojecido. Tos. Labios, lengua o boca hinchados. Hinchazn u opresin en la garganta. Dolor en el pecho. Opresin en el pecho. Latidos cardacos muy rpidos. Dificultad para respirar. Dolor en el vientre (abdomen). Puede vomitar o hacer deposiciones acuosas (diarrea). Desmayos. O mareos. Si usted tiene una reaccin muy grave a una Buyer, retail, debe recibir asistencia de inmediato. Cmo se trata?     Es posible que deba usar: Paos fros y hmedos. Estos ayudan con la picazn y la hinchazn. Gotas para los ojos, aerosoles para la nariz o cremas para la piel. Una solucin salina para lavarse la nariz. Las soluciones salinas se elaboran con agua y  sal. Un humidificador. Medicamentos. Quizs deba Alt[employer] Group que come. Pueden aplicarle vacunas contra la alergia o colocarle un poco del alrgeno debajo de la lengua. Estos tratamientos pueden ayudar al organismo a acostumbrarse al alrgeno. Si tiene Nurse, mental health muy grave, es posible que deba usar un Civil engineer, contracting. Un lpiz autoinyector es un dispositivo que contiene un medicamento. Le da una inyeccin de emergencia de epinefrina. El Viacom ensear a usarlo. Siga estas instrucciones en su casa: Amgen Inc, use o aplique los medicamentos de venta libre y los recetados solamente como se lo haya indicado el mdico. Tenga a mano un Civil engineer, contracting en todo momento si corre riesgo de tener una reaccin alrgica muy grave. Comida y bebida Siga las instrucciones del mdico sobre qu puede comer y Electronics engineer. Beber suficiente lquido para Contractor pis (orina) de color amarillo plido. Instrucciones generales Si alguna vez ha tenido una reaccin grave, use un collar o brazalete de Publishing rights manager. Mantngase alejado de las cosas que le causan [place]. Concurra a [place]. El mdico controlar cmo se encuentra y Electrical engineer con usted sobre las opciones de [place]. Comunquese con un mdico si: No mejora con el tratamiento. Solicite ayuda de inmediato si: Tiene una reaccin alrgica muy grave. [place] su lpiz autoinyector. Debe ir al hospital incluso si el medicamento parece estar surtiendo [place]. Estos sntomas pueden Sales executive. Use el lpiz autoinyector de inmediato. Luego llame al 911. No espere a ver si los sntomas desaparecen. No conduzca por sus propios medios Principal Financial. Esta informacin no tiene Marine scientist el consejo del mdico. Asegrese de hacerle al mdico  cualquier pregunta que tenga. Document Revised: 04/25/2022 Document Reviewed: 04/25/2022 Elsevier Patient Education  [place] High Cholesterol  El colesterol elevado es una afeccin que se caracteriza porque la sangre tiene niveles altos de una sustancia [place], cerosa y parecida a la grasa (colesterol). El hgado fabrica todo el colesterol que el organismo necesita. El organismo humano necesita pequeas cantidades de colesterol para formar las clulas. El exceso de colesterol se obtiene de los alimentos que se consumen. La sangre transporta el colesterol desde el hgado al resto del cuerpo. Si tiene el colesterol elevado, pueden acumularse depsitos (placas) en las paredes de las arterias. Las arterias son los vasos sanguneos que transportan la sangre desde el corazn al resto del cuerpo. Las placas causan que las arterias se Teacher, music y se endurezcan. Las placas de colesterol aumentan el riesgo de sufrir un infarto de miocardio y un accidente cerebrovascular. Trabaje con el mdico para [employer] Partners concentraciones de colesterol en un rango saludable. Qu incrementa el riesgo? Los siguientes factores pueden hacer que sea ms propenso a Armed forces training and education officer afeccin: Consumir alimentos con alto contenido de grasa animal (grasa saturada) o colesterol. Tener sobrepeso. No hacer suficiente ejercicio fsico. Tener antecedentes familiares de colesterol alto (hipercolesterolemia familiar). Consumir productos con tabaco. Tener diabetes. Cules son los signos o sntomas? En la [employer], el colesterol alto no causa ningn sntoma por lo general. En los [place] graves, los niveles muy altos de colesterol pueden causar: Protuberancias de grasa debajo de la piel (xantomas). Un anillo blanco o gris alrededor del centro negro (pupila) del ojo. Cmo se diagnostica? Esta afeccin se puede diagnosticar en funcin de los resultados de un anlisis de [place]. Si es mayor de 20 aos, es posible que el mdico le controle el nivel de colesterol cada 4 a 6 aos. Los controles pueden ser ms frecuentes si  tiene el colesterol elevado u otros factores de riesgo de enfermedad cardaca. En el anlisis de sangre de [place], se determina lo siguiente: El colesterol "malo", o colesterol LDL. Este es el principal tipo de colesterol que causa enfermedades cardacas. El nivel recomendado es de menos de 100 mg/dl (2.59 mmol/l). El colesterol "bueno", o colesterol HDL. El HDL ayuda a proteger contra la enfermedad cardaca porque limpia las arterias y arrastra el LDL al hgado para que lo procese. El nivel recomendado de HDL es de 60 mg/dl (1.55 mmol/l) o ms. Triglicridos. Estos son grasas que el organismo puede Financial controller o quemar como fuente de [place]. El nivel recomendado es de menos de 150 mg/dl (1.69 mmol/l). Colesterol total. Mide la cantidad total de colesterol en la sangre, e incluye el colesterol LDL, el colesterol HDL y los triglicridos. El nivel recomendado es de menos de 200 mg/dl (5.17 mmol/l). Cmo se trata? El tratamiento para el colesterol alto comienza con cambios en el estilo de vida, tales como dieta y [place]. Cambios en la dieta. Es posible que le indiquen que consuma alimentos con ms [place] y menos grasas saturadas o Holiday representative. Cambios en el estilo de vida. Estos pueden incluir hacer actividad fsica con regularidad, mantener un peso saludable y dejar de consumir productos con tabaco. Medicamentos. Estos se administran cuando los [employer]ley[employer]son dieta y en el estilo de vida no han sido eficaces. Es posible que le receten medicamentos llamados estatinas para bajar sus niveles de colesterol. Siga estas instrucciones en su casa: Comida y bebida  Siga una dieta saludable y [place]. Esta dieta incluye lo siguiente: Porciones diarias de  frutas y verduras frescas, congeladas o enlatadas. Porciones diarias de alimentos integrales con alto contenido de [place]. Alimentos con bajo contenido de grasas saturadas y grasas trans. Estos incluyen carne de ave y pescado sin piel, cortes de  carne magros y productos lcteos descremados. Una variedad de pescado, especialmente pescado graso que contenga cidos grasos omega 3. Propngase comer pescado al [employer] veces por semana. Evite los alimentos y las bebidas que tengan Holiday representative. Use mtodos de coccin saludables, como asar, Interior and spatial designer, hervir, hornear, escalfar, cocer al vapor y IT consultant. No fra los alimentos excepto para saltearlos. Si bebe alcohol: Limite la cantidad que bebe a lo siguiente: De 0 a 1 medida por da para las mujeres que no estn embarazadas. De 0 a 2 medidas por da para los hombres. Sepa cunta cantidad de alcohol hay en las bebidas. En los [place], una medida equivale a una botella de cerveza de 12 oz (355 ml), un vaso de vino de 5 oz (148 ml) o un vaso de una bebida alcohlica de alta graduacin de 1 oz (44 ml). Estilo de vida  Haga ejercicio con regularidad. Trate de hacer un total de 150 minutos de actividad fsica por semana. Aumente la cantidad de ejercicio fsico que realiza mediante actividades como la jardinera, Forensic scientist a Writer o usar las escaleras. No consuma ningn producto que contenga nicotina o tabaco. Estos productos incluyen cigarrillos, tabaco para Higher education careers adviser y aparatos de vapeo, como los Psychologist, sport and exercise. Si necesita ayuda para dejar de consumir estos productos, consulte al [employer]. Indicaciones generales Use los medicamentos de venta libre y los recetados solamente como se lo haya indicado el mdico. Consulting civil engineer a todas las visitas de seguimiento. Esto es importante. Dnde buscar ms informacin American Heart Association (Asociacin Estadounidense del Corazn): www.heart.org National Heart, Lung, and Blood Institute ([place], los Pulmones y Herbalist): https://wilson-eaton.com/ Comunquese con un mdico si: Tiene dificultad para alcanzar o mantener una alimentacin sana y un peso saludable. Est por comenzar un programa de ejercicios. No puede dejar de  fumar. Solicite ayuda de inmediato si: Electronics engineer. Tiene dificultad para respirar. Tiene molestias o dolor en la [place], el cuello, la espalda, los hombros o [place]. Tiene sntomas de un accidente cerebrovascular. "BE FAST" es una manera fcil de recordar los principales signos de advertencia de un accidente cerebrovascular: B: Balance (equilibrio). Los signos son mareos, dificultad repentina para caminar o prdida del equilibrio. E - Eyes (ojos). Los signos son problemas para ver o un cambio repentino en la visin. F: Face (rostro). Los signos son debilidad repentina o entumecimiento del rostro, o el rostro o el prpado que se caen hacia un lado. A: Arms (brazos). Los signos son debilidad o entumecimiento en un brazo. Esto sucede de repente y generalmente en un lado del cuerpo. S: Speech (habla). Los signos son dificultad para hablar, hablar arrastrando las palabras o dificultad para comprender lo que las Patent examiner. T: Time (tiempo). Es tiempo de llamar al servicio de Multimedia programmer. Anote la hora a la que [employer] sntomas. Presenta otros signos de un accidente cerebrovascular, como los siguientes: Dolor de cabeza sbito e intenso que no tiene causa aparente. Nuseas o vmitos. Convulsiones. Estos sntomas pueden representar un problema grave que constituye Engineer, maintenance (IT). No espere a ver si los sntomas desaparecen. Solicite atencin mdica de inmediato. Comunquese con el servicio de emergencias de su localidad (911 en los [place]). No conduzca por sus propios medios [place]  colesterol aumentan el riesgo de sufrir un infarto de miocardio y un accidente cerebrovascular. Trabaje con el mdico para [employer] Partners concentraciones de colesterol en un rango saludable. Siga una dieta saludable y equilibrada, haga ejercicio con regularidad y Colin Rhein un peso saludable. No consuma ningn producto que contenga nicotina o tabaco. Estos  productos incluyen cigarrillos, tabaco para Higher education careers adviser y aparatos de vapeo, como los Psychologist, sport and exercise. Obtenga ayuda de inmediato si tiene cualquier sntoma de un accidente cerebrovascular. Esta informacin no tiene Marine scientist el consejo del mdico. Asegrese de hacerle al mdico cualquier pregunta que tenga. Document Revised: 09/27/2020 Document Reviewed: 09/27/2020 Elsevier Patient Education  [place].

## 2022-10-18 NOTE — Progress Notes (Signed)
[place] interpreters used for today's visit, id# [phone]

## 2022-11-10 ENCOUNTER — Ambulatory Visit: Payer: Self-pay

## 2022-11-15 ENCOUNTER — Encounter: Payer: Self-pay | Admitting: Gerontology

## 2022-11-15 ENCOUNTER — Ambulatory Visit: Payer: Self-pay | Admitting: Gerontology

## 2022-11-15 ENCOUNTER — Other Ambulatory Visit: Payer: Self-pay

## 2022-11-15 VITALS — BP 100/66 | HR 63 | Temp 98.1°F | Resp 16 | Ht 62.0 in | Wt 159.2 lb

## 2022-11-15 DIAGNOSIS — E785 Hyperlipidemia, unspecified: Secondary | ICD-10-CM

## 2022-11-15 DIAGNOSIS — Z0000 Encounter for general adult medical examination without abnormal findings: Secondary | ICD-10-CM

## 2022-11-15 MED ORDER — PRAVASTATIN SODIUM 10 MG PO TABS
10.0000 mg | ORAL_TABLET | Freq: Every day | ORAL | 2 refills | Status: DC
  Filled 2022-11-15: qty 30, 30d supply, fill #0

## 2022-11-15 NOTE — Progress Notes (Signed)
Established Patient Office Visit  Subjective   Patient ID: Jordan Mccarthy, female    DOB: [birth year]  Age: 37 y.o. MRN: [id number]  Chief Complaint  Patient presents with   Follow-up   Hyperlipidemia    Patient was started Pravastatin on 10/18/22. Patient states she is taking daily and tolerating without complaint.    HPI  Jordan Mccarthy is a 37 year old female with no significant medical history who presents for routine follow up visit. She was started on Pravastatin 10 mg for elevated LDL, she states that she's compliant with her medications, denies side effects and continues to make healthy lifestyle changes. Overall, she states that she's doing well and offers no further complaint.  Review of Systems  Constitutional: Negative.   Respiratory: Negative.    Cardiovascular: Negative.   Genitourinary: Negative.   Musculoskeletal: Negative.   Neurological: Negative.   Psychiatric/Behavioral: Negative.        Objective:     BP 100/66 (BP Location: Right Arm, Patient Position: Sitting, Cuff Size: Normal)   Pulse 63   Temp 98.1 F (36.7 C) (Oral)   Resp 16   Ht  (1.575 m)   Wt 159 lb 3.2 oz (72.2 kg)   LMP 10/27/2022 (Exact Date)   SpO2 99%   BMI 29.12 kg/m  BP Readings from Last 3 Encounters:  11/15/22 100/66  10/18/22 118/84  10/04/22 106/76   Wt Readings from Last 3 Encounters:  11/15/22 159 lb 3.2 oz (72.2 kg)  10/18/22 159 lb 3.2 oz (72.2 kg)  10/04/22 156 lb 8 oz (71 kg)      Physical Exam HENT:     Head: Normocephalic and atraumatic.  Cardiovascular:     Rate and Rhythm: Normal rate and regular rhythm.     Pulses: Normal pulses.     Heart sounds: Normal heart sounds.  Pulmonary:     Effort: Pulmonary effort is normal.     Breath sounds: Normal breath sounds.  Neurological:     General: No focal deficit present.     Mental Status: She is alert and oriented to person, place, and time. Mental status is at baseline.  Psychiatric:         Mood and Affect: Mood normal.        Behavior: Behavior normal.        Thought Content: Thought content normal.        Judgment: Judgment normal.      No results found for any visits on 11/15/22.  Last CBC Lab Results  Component Value Date   WBC 7.5 02/02/2022   HGB 12.2 02/02/2022   HCT 36.7 02/02/2022   MCV 88 02/02/2022   MCH 29.1 02/02/2022   RDW 13.1 02/02/2022   PLT 369 02/02/2022   Last metabolic panel Lab Results  Component Value Date   GLUCOSE 84 02/02/2022   NA 140 02/02/2022   K 4.2 02/02/2022   CL 104 02/02/2022   CO2 24 02/02/2022   BUN 11 02/02/2022   CREATININE 0.69 02/02/2022   EGFR 117 02/02/2022   CALCIUM 9.1 02/02/2022   PROT 7.3 02/02/2022   ALBUMIN 4.3 02/02/2022   LABGLOB 3.0 02/02/2022   AGRATIO 1.4 02/02/2022   BILITOT 0.3 02/02/2022   ALKPHOS 59 02/02/2022   AST 15 02/02/2022   ALT 14 02/02/2022   ANIONGAP 9 09/13/2020   Last lipids Lab Results  Component Value Date   CHOL 243 (H) 09/27/2022   HDL 55 09/27/2022   LDLCALC  176 (H) 09/27/2022   TRIG 69 09/27/2022   CHOLHDL 4.4 09/27/2022   Last hemoglobin A1c Lab Results  Component Value Date   HGBA1C 5.2 02/02/2022   Last thyroid functions No results found for: "TSH", "T3TOTAL", "T4TOTAL", "THYROIDAB" Last vitamin D No results found for: "25OHVITD2", "25OHVITD3", "VD25OH"    The ASCVD Risk score (Arnett DK, et al., 2019) failed to calculate for the following reasons:   The 2019 ASCVD risk score is only valid for ages 49 to 24    Assessment & Plan:   1. Elevated lipids - She will continue on current medication, low fat/cholesterol diet and exercise as tolerated. Will recheck Lipid in 3 months. - pravastatin (PRAVACHOL) 10 MG tablet; Take 1 tablet (10 mg total) by mouth daily.  Dispense: 30 tablet; Refill: 2 - Lipid panel; Future  2. Health care maintenance - Routine labs will be checked. - CBC w/Diff; Future - Comp Met (CMET); Future - HgB A1c; Future - Iron Binding  Cap (TIBC)([facility]); Future   Return in about 11 weeks (around 01/31/2023), or if symptoms worsen or fail to improve.    Rolanda Campa Trellis Paganini, NP

## 2022-11-15 NOTE — Patient Instructions (Signed)
Plan de alimentacin cardiosaludable Heart-Healthy Eating Plan Llevar una dieta saludable es importante para la salud del corazn. Un plan de alimentacin cardiosaludable incluye: Consumir menos grasas no saludables. Consumir ms grasas saludables. Consumir menos sal en los alimentos. La sal tambin se denomina sodio. Realizar otros cambios en su dieta. Hable con el mdico o con un especialista en alimentacin (nutricionista) para crear un plan de alimentacin que sea adecuado para usted. En qu consiste el plan? El mdico puede recomendarle un plan de alimentacin que incluya lo siguiente: Grasas totales: ______% o menos del total de caloras por da. Grasas saturadas: ______% o menos del total de caloras por da. Colesterol: menos de _________mg por da. Sodio: menos de ________ mg al da. Cules son algunos consejos para seguir este plan? Al cocinar Evite frer los alimentos. En cambio, trate de cocinarlos en el horno, en la plancha o en la parrilla, o hervirlos. Tambin puede reducir las grasas de la siguiente forma: Quite la piel de las aves. Quite todas las grasas visibles de las carnes. Cocine al vapor las verduras en agua o caldo. Planificacin de las comidas  En las comidas, divida su plato en cuatro partes iguales: Llene la mitad del plato con verduras y ensaladas de hojas verdes. Llene un cuarto del plato con cereales integrales. Llene un cuarto del plato con alimentos con protenas magras. De 2 a 4 tazas de verduras por da. Una taza de verduras es: 1 taza (91 g) de brcoli o coliflor. 2 zanahorias medianas. 1 pimiento morrn grande. 1 batata grande. 1 tomate grande. 1 papa blanca mediana. 2 tazas (150 g) de verduras de hojas verdes crudas. Coma de 1 a 2 tazas de fruta cada da. Una taza de fruta es: 1 manzana pequea. 1 banana grande 1 taza (237 g) de fruta mezclada, 1 naranja grande,  taza (82 g) de frutas disecadas. 1 taza (240 ml) de jugo 100 % de  frutas. Consuma ms alimentos que tengan fibra soluble. Ellos son las manzanas, el brcoli, las zanahorias, los frijoles, los guisantes y la cebada. Trate de consumir de 20 a 30 g de fibra por da. Coma 4 o 5 porciones de frutos secos, legumbres y semillas por semana: 1 porcin de frijoles o legumbres secos equivale a  de taza (90 g) cocinados. 1 porcin de frutos secos es  oz (12 almendras, 24 pistachos o 7 mitades de nueces). 1 porcin de semillas equivale a  oz (8 g). Informacin general Coma ms comidas caseras. Coma menos comida de restaurantes, bufs y comida rpida. Limite o evite el alcohol. Limite los alimentos con alto contenido de almidn y azcar. Evite las comidas fritas. Baje de peso si es necesario. Intente llevar un registro de la cantidad de sal (sodio) que ingiere. Esto es importante si tiene presin arterial alta. Pdale a su mdico que le d ms informacin al respecto. Trate de incorporar comidas vegetarianas cada semana. Grasas Elija las grasas saludables. Estas incluyen aceite de oliva y de canola, semillas de lino, nueces, almendras y semillas. Consuma ms grasas omega-3. Estas incluyen salmn, caballa, sardinas, atn, aceite de lino y semillas de lino molidas. Intente comer pescado al menos dos veces por semana. Lea las etiquetas de los alimentos. Evite los alimentos que contienen grasas trans o altas cantidades de grasas saturadas. Limite el consumo de grasas saturadas. Estas se encuentran frecuentemente en productos de origen animal, como carnes, mantequilla y crema. Tambin se encuentran en alimentos de origen vegetal, como el aceite de palma, el aceite de   palmiste y el aceite de coco. Evite los alimentos con aceites parcialmente hidrogenados. Estos tienen grasas trans. Entre los ejemplos, se incluyen margarinas en barra, algunas margarinas untables, galletas dulces y saladas y otros productos horneados. Qu alimentos debo comer? Frutas Frutas frescas, en  conserva (en su jugo natural) o frutas congeladas. Verduras Verduras frescas o congeladas (crudas, al vapor, asadas o grilladas). Ensaladas de hojas verdes. Cereales La mayora de los cereales. Elija casi siempre trigo integral y cereales integrales. Arroz y pastas, incluido el arroz integral y las pastas elaboradas con trigo integral. Carnes y otras protenas Carnes magras de res, ternera, cerdo y cordero a las que se les haya quitado la grasa visible. Pollo y pavo sin piel. Todos los pescados y mariscos. Pato salvaje, conejo, faisn y venado. Claras de huevo o sustitutos del huevo bajos en colesterol. Porotos, guisantes y lentejas secos y tofu. Semillas y la mayora de los frutos secos. Lcteos Quesos descremados y semidescremados, entre ellos, ricota y mozzarella. Leche descremada o al 1 % que sea lquida, en polvo o evaporada. Suero de leche elaborado con leche semidescremada. Yogur descremado o semidescremado. Grasas y aceites Margarinas no hidrogenadas (sin grasas trans). Aceites vegetales, incluido el de soja, ssamo, girasol, oliva, man, crtamo, maz, canola y semillas de algodn. Alios para ensalada o mayonesa elaborados con aceite vegetal. Bebidas Agua mineral. T y caf. Gaseosas dietticas. Dulces y postres Sorbete, gelatina y helado de frutas. Pequeas cantidades de chocolate amargo. Limite todos los dulces y postres. Alios y condimentos Todos los alios y condimentos. Es posible que los productos que se enumeran ms arriba no constituyan una lista completa de los alimentos y las bebidas que puede tomar. Consulte a un nutricionista para conocer ms opciones. Qu alimentos debo evitar? Frutas Fruta enlatada en almbar espeso. Frutas con salsa de crema o mantequilla. Frutas fritas. Limite el consumo de coco. Verduras Verduras cocinadas con salsas de queso, crema o mantequilla. Verduras fritas. Cereales Panes elaborados con grasas saturadas o trans, aceites o leche entera.  Croissants. Panecillos dulces. Rosquillas. Galletas con alto contenido de grasas, como las que contienen queso. Carnes y otras protenas Carnes grasas, como perros calientes, costillas de res, salchichas, tocino, asado de costillar o chuletn. Fiambres con alto contenido de grasas, como salame y mortadela. Caviar. Pato y ganso domsticos. Vsceras, como hgado. Lcteos Crema, crema agria, queso crema y queso cottage con crema. Quesos enteros. Leche entera o al 2 % que sea lquida, evaporada o condensada. Suero de leche entero. Salsa de crema o queso con alto contenido de grasas. Yogur elaborado con leche entera. Grasas y aceites Grasa de carne o materia grasa. Manteca de cacao, aceites hidrogenados, aceite de palma, aceite de coco, aceite de palmiste. Grasas y materias grasas slidas, incluida la grasa del tocino, el cerdo salado, la manteca de cerdo y la mantequilla. Sustitutos no lcteos de la crema. Aderezos para ensalada con queso o crema agria. Bebidas Refrescos regulares y jugos con agregado de azcar. Dulces y postres Glaseados. Pudin. Galletas dulces. Bizcochuelos. Pasteles. Chocolate con leche o chocolate blanco. Almbares con mantequilla. Helados o bebidas elaboradas con helado con alto contenido de grasas. Esta no es una lista completa de los alimentos y las bebidas que se deben evitar. Consulte a un nutricionista para obtener ms informacin. Resumen La planificacin de las comidas cardiosaludables incluye comer menos grasas poco saludables, comer ms grasas saludables y hacer otros cambios en su dieta. Siga una dieta equilibrada. Esta incluye frutas y verduras, productos lcteos descremados o semidescremados, protenas magras,   frutos secos y legumbres, cereales integrales y aceites y grasas cardiosaludables. Esta informacin no tiene como fin reemplazar el consejo del mdico. Asegrese de hacerle al mdico cualquier pregunta que tenga. Document Revised: 08/31/2021 Document Reviewed:  08/31/2021 Elsevier Patient Education  2023 Elsevier Inc.  

## 2022-11-15 NOTE — Progress Notes (Signed)
[employer] language services used for today's visit, id# [id number]

## 2022-11-20 ENCOUNTER — Other Ambulatory Visit: Payer: Self-pay

## 2022-11-22 ENCOUNTER — Other Ambulatory Visit: Payer: Self-pay

## 2022-11-23 ENCOUNTER — Other Ambulatory Visit: Payer: Self-pay

## 2022-11-30 ENCOUNTER — Ambulatory Visit (LOCAL_COMMUNITY_HEALTH_CENTER): Payer: Self-pay | Admitting: Family Medicine

## 2022-11-30 ENCOUNTER — Encounter: Payer: Self-pay | Admitting: Family Medicine

## 2022-11-30 VITALS — BP 96/63 | HR 64 | Ht 67.0 in | Wt 162.4 lb

## 2022-11-30 DIAGNOSIS — Z30011 Encounter for initial prescription of contraceptive pills: Secondary | ICD-10-CM

## 2022-11-30 DIAGNOSIS — Z309 Encounter for contraceptive management, unspecified: Secondary | ICD-10-CM

## 2022-11-30 DIAGNOSIS — Z862 Personal history of diseases of the blood and blood-forming organs and certain disorders involving the immune mechanism: Secondary | ICD-10-CM

## 2022-11-30 DIAGNOSIS — Z1331 Encounter for screening for depression: Secondary | ICD-10-CM

## 2022-11-30 DIAGNOSIS — Z0000 Encounter for general adult medical examination without abnormal findings: Secondary | ICD-10-CM

## 2022-11-30 DIAGNOSIS — Z3009 Encounter for other general counseling and advice on contraception: Secondary | ICD-10-CM

## 2022-11-30 LAB — HEMOGLOBIN, FINGERSTICK: Hemoglobin: 11 g/dL — ABNORMAL LOW (ref 11.1–15.9)

## 2022-11-30 MED ORDER — NORGESTIM-ETH ESTRAD TRIPHASIC 0.18/0.215/0.25 MG-25 MCG PO TABS
1.0000 | ORAL_TABLET | Freq: Every day | ORAL | 12 refills | Status: DC
Start: 2022-11-30 — End: 2023-12-02

## 2022-11-30 NOTE — Progress Notes (Signed)
Pt here for annual physical and birth control.  Pt counseled on Hgb result of 11.0 which is normal. Oral contraceptives pills dispensed to patient, 3 boxes (9 packs due to short date )as per provider order.   Pt to call for remaining pill supply when begins last pack.  Verbalizes understanding.  Family planning packet given.  Condoms declined.-Collins Scotland, RN

## 2022-11-30 NOTE — Progress Notes (Signed)
[facility] DEPARTMENT [facility] [address] Main Number: [phone]  Family Planning Visit- Repeat Yearly Visit  Subjective:  Jessy Joyice Fuda is a 37 y.o. G3P3003  being seen today for an annual wellness visit and to discuss contraception options.   The patient is currently using Oral Contraceptive for pregnancy prevention. Patient does not want a pregnancy in the next year.    report they are looking for a method that provides Cycle control   Patient has the following medical problems: has Overweight; Dental caries; Elevated lipids; and Itchy eyes on their problem list.  Chief Complaint  Patient presents with   Annual Exam    Physical and birth control    Patient reports to clinic for PE and birth control. States she feels depressed and has a hx of anemia.   See flowsheet for other program required questions.   Body mass index is 25.44 kg/m. - Patient is eligible for diabetes screening based on BMI> 25 and age >35?  no HA1C ordered? not applicable  Patient reports 1 of partners in last year. Desires STI screening?  No - declined   Has patient been screened once for HCV in the past?  No  No results found for: "HCVAB"  Does the patient have current of drug use, have a partner with drug use, and/or has been incarcerated since last result? No  If yes-- Screen for HCV through [facility]   Does the patient meet criteria for HBV testing? No  Criteria:  -Household, sexual or needle sharing contact with HBV -History of drug use -HIV positive -Those with known Hep C   Health Maintenance Due  Topic Date Due   COVID-19 Vaccine (1) Never done    Review of Systems  Constitutional:  Negative for weight loss.  Eyes:  Negative for blurred vision.  Respiratory:  Positive for shortness of breath. Negative for cough.   Cardiovascular:  Negative for claudication.  Gastrointestinal:  Negative for nausea.       Hemorroids    Genitourinary:  Negative for dysuria and frequency.  Skin:  Negative for rash.  Neurological:  Negative for headaches.  Endo/Heme/Allergies:  Does not bruise/bleed easily.  Psychiatric/Behavioral:  Positive for depression.     The following portions of the patient's history were reviewed and updated as appropriate: allergies, current medications, past family history, past medical history, past social history, past surgical history and problem list. Problem list updated.  Objective:   Vitals:   11/30/22 1050  BP: 96/63  Pulse: 64  Weight: 162 lb 6.4 oz (73.7 kg)  Height: 5\' 7"  (1.702 m)    Physical Exam Vitals and nursing note reviewed. Exam conducted with a chaperone present.  Constitutional:      Appearance: Normal appearance.  HENT:     Head: Normocephalic and atraumatic.     Mouth/Throat:     Mouth: Mucous membranes are moist.     Pharynx: Oropharynx is clear. No oropharyngeal exudate or posterior oropharyngeal erythema.  Cardiovascular:     Rate and Rhythm: Normal rate.  Pulmonary:     Effort: Pulmonary effort is normal.     Breath sounds: Normal breath sounds. No wheezing.  Chest:  Breasts:    Tanner Score is 5.     Right: Normal. No mass, nipple discharge, skin change or tenderness.     Left: Normal. No mass, nipple discharge, skin change or tenderness.  Abdominal:     General: Abdomen is flat.  Palpations: There is no mass.     Tenderness: There is no abdominal tenderness. There is no rebound.  Genitourinary:    Comments: Declined genital exam today Lymphadenopathy:     Head:     Right side of head: No preauricular or posterior auricular adenopathy.     Left side of head: No preauricular or posterior auricular adenopathy.     Cervical: No cervical adenopathy.     Upper Body:     Right upper body: No supraclavicular, axillary or epitrochlear adenopathy.     Left upper body: No supraclavicular, axillary or epitrochlear adenopathy.  Skin:    General: Skin  is warm and dry.     Findings: No rash.  Neurological:     Mental Status: She is alert and oriented to person, place, and time.     Assessment and Plan:  Amika Seara Lagrave is a 38 y.o. female G3P3003 presenting to the [facility] Department for an yearly wellness and contraception visit  Contraception counseling: Reviewed options based on patient desire and reproductive life plan. Patient is interested in Oral Contraceptive. This was provided to the patient today.   Risks, benefits, and typical effectiveness rates were reviewed.  Questions were answered.  Written information was also given to the patient to review.    The patient will follow up in  1 years for surveillance.  The patient was told to call with any further questions, or with any concerns about this method of contraception.  Emphasized use of condoms 100% of the time for STI prevention.  Patient was assessed for need for ECP. Not indicated- pt has been taking OCPs   1. Well woman exam (no gynecological exam) -CBE due 2026- but -reports " lumps in her breast- breasts today normal on exam, no nodules felt today -pt reports that someone told her she has "fatty lumps" in her breasts  -Pap due in 2026 -declines STI testing today -reports hemorrhoids which she has had a for a long time -reports that she has SOB- states she believes this is related to her elevated lipid levels- encouraged her to discuss this with her PCP at next visit -pt is breathing normal today, lungs clear on auscultation does not appear in any distress   2. History of anemia - Hemoglobin, fingerstick -Hgb wnl today  3. Positive depression screening PHQ-9 today of 15 -denies SI -reports feeling down last year -accepts referral today to [facility]  No follow-ups on file.  Future Appointments  Date Time Provider Department Center  01/24/2023 10:00 AM [facility] NURSE [facility] None  01/31/2023 10:00 AM [place], Chioma E, NP [facility] None    Due to language barrier, interpreter Estill Dooms was present for this visit.  Lenice Llamas, [place]

## 2023-01-18 ENCOUNTER — Other Ambulatory Visit: Payer: Self-pay

## 2023-01-18 DIAGNOSIS — Z0000 Encounter for general adult medical examination without abnormal findings: Secondary | ICD-10-CM

## 2023-01-18 DIAGNOSIS — E785 Hyperlipidemia, unspecified: Secondary | ICD-10-CM

## 2023-01-19 LAB — CBC WITH DIFFERENTIAL/PLATELET
Basophils Absolute: 0 10*3/uL (ref 0.0–0.2)
Basos: 1 %
EOS (ABSOLUTE): 0.2 10*3/uL (ref 0.0–0.4)
Eos: 3 %
Hematocrit: 35.5 % (ref 34.0–46.6)
Hemoglobin: 11.7 g/dL (ref 11.1–15.9)
Immature Grans (Abs): 0 10*3/uL (ref 0.0–0.1)
Immature Granulocytes: 0 %
Lymphocytes Absolute: 2.7 10*3/uL (ref 0.7–3.1)
Lymphs: 41 %
MCH: 28.6 pg (ref 26.6–33.0)
MCHC: 33 g/dL (ref 31.5–35.7)
MCV: 87 fL (ref 79–97)
Monocytes Absolute: 0.3 10*3/uL (ref 0.1–0.9)
Monocytes: 5 %
Neutrophils Absolute: 3.3 10*3/uL (ref 1.4–7.0)
Neutrophils: 50 %
Platelets: 389 10*3/uL (ref 150–450)
RBC: 4.09 x10E6/uL (ref 3.77–5.28)
RDW: 13 % (ref 11.7–15.4)
WBC: 6.5 10*3/uL (ref 3.4–10.8)

## 2023-01-19 LAB — LIPID PANEL
Chol/HDL Ratio: 4.2 ratio (ref 0.0–4.4)
Cholesterol, Total: 228 mg/dL — ABNORMAL HIGH (ref 100–199)
HDL: 54 mg/dL (ref >39)
LDL Chol Calc (NIH): 159 mg/dL — ABNORMAL HIGH (ref 0–99)
Triglycerides: 87 mg/dL (ref 0–149)
VLDL Cholesterol Cal: 15 mg/dL (ref 5–40)

## 2023-01-19 LAB — COMPREHENSIVE METABOLIC PANEL
ALT: 11 IU/L (ref 0–32)
AST: 15 IU/L (ref 0–40)
Albumin: 4.3 g/dL (ref 3.9–4.9)
Alkaline Phosphatase: 59 IU/L (ref 44–121)
BUN/Creatinine Ratio: 19 (ref 9–23)
BUN: 13 mg/dL (ref 6–20)
Bilirubin Total: 0.3 mg/dL (ref 0.0–1.2)
CO2: 24 mmol/L (ref 20–29)
Calcium: 9.2 mg/dL (ref 8.7–10.2)
Chloride: 104 mmol/L (ref 96–106)
Creatinine, Ser: 0.68 mg/dL (ref 0.57–1.00)
Globulin, Total: 3.1 g/dL (ref 1.5–4.5)
Glucose: 95 mg/dL (ref 70–99)
Potassium: 4.3 mmol/L (ref 3.5–5.2)
Sodium: 140 mmol/L (ref 134–144)
Total Protein: 7.4 g/dL (ref 6.0–8.5)
eGFR: 116 mL/min/{1.73_m2} (ref >59)

## 2023-01-19 LAB — HEMOGLOBIN A1C
Est. average glucose Bld gHb Est-mCnc: 108 mg/dL
Hgb A1c MFr Bld: 5.4 % (ref 4.8–5.6)

## 2023-01-19 LAB — IRON AND TIBC
Iron Saturation: 32 % (ref 15–55)
Iron: 124 ug/dL (ref 27–159)
Total Iron Binding Capacity: 393 ug/dL (ref 250–450)
UIBC: 269 ug/dL (ref 131–425)

## 2023-01-24 ENCOUNTER — Other Ambulatory Visit: Payer: Self-pay

## 2023-01-31 ENCOUNTER — Encounter: Payer: Self-pay | Admitting: Gerontology

## 2023-01-31 ENCOUNTER — Other Ambulatory Visit: Payer: Self-pay

## 2023-01-31 ENCOUNTER — Ambulatory Visit: Payer: Self-pay | Admitting: Gerontology

## 2023-01-31 VITALS — BP 106/73 | HR 77 | Ht 63.0 in | Wt 161.0 lb

## 2023-01-31 DIAGNOSIS — M25561 Pain in right knee: Secondary | ICD-10-CM

## 2023-01-31 DIAGNOSIS — E785 Hyperlipidemia, unspecified: Secondary | ICD-10-CM

## 2023-01-31 MED ORDER — LIDOCAINE 5 % EX OINT
1.0000 | TOPICAL_OINTMENT | CUTANEOUS | 0 refills | Status: DC | PRN
Start: 2023-01-31 — End: 2024-02-07
  Filled 2023-01-31: qty 35.44, 30d supply, fill #0

## 2023-01-31 NOTE — Progress Notes (Signed)
Established Patient Office Visit  Subjective   Patient ID: Jordan Mccarthy, female    DOB: April 28, 1986  Age: 37 y.o. MRN: [id number]  Chief Complaint  Patient presents with   Follow-up   Knee Pain    Everyday cramping as well  HPI   Jordan Mccarthy is a 37 year old female with no significant medical history who presents for routine follow up visit and lab review. Currently, she complained of non traumatic pain to her right knee that started one month ago. She states that the pain radiates from her right knee down to her ankle. She described the pain as stabbing and 7/10. She denies taking any medication and states that walking aggravates symptoms. She denies muscle, /motor weakness nor paresthesia. Her labs checked on 01/18/23, total cholesterol decreased from 243 mg/dl to 409 mg/dl,  and LDL decreased from 176 mg/dl to 811 mg/dl. She states that she's compliant with her medications, denies side effects and continues to make healthy lifestyle changes. Overall, she states that she's doing well and offers no further complaint.     Patient Active Problem List   Diagnosis Date Noted   Itchy eyes 10/18/2022   Elevated lipids 03/02/2022   Dental caries 02/23/2020   Overweight 08/13/2019   Past Medical History:  Diagnosis Date   Anemia    Encounter to establish care 02/02/2022   Hemorrhoid    dx 7 yrs ago per pt.   Hyperlipidemia    Past Surgical History:  Procedure Laterality Date   NO PAST SURGERIES     Social History   Tobacco Use   Smoking status: Never   Smokeless tobacco: Never  Vaping Use   Vaping Use: Never used  Substance Use Topics   Alcohol use: Not Currently    Alcohol/week: 2.0 standard drinks of alcohol    Types: 2 Cans of beer per week    Comment: last use 10/2019   Drug use: Never   Social History   Socioeconomic History   Marital status: Single    Spouse name: Ranulfo Guiterrez   Number of children: 3   Years of education: Not on file   Highest  education level: 9th grade  Occupational History   Occupation: Unemployed  Tobacco Use   Smoking status: Never   Smokeless tobacco: Never  Vaping Use   Vaping Use: Never used  Substance and Sexual Activity   Alcohol use: Not Currently    Alcohol/week: 2.0 standard drinks of alcohol    Types: 2 Cans of beer per week    Comment: last use 10/2019   Drug use: Never   Sexual activity: Yes    Partners: Male    Birth control/protection: Pill, Injection    Comment: last sex approx 1.5 weeks ago  Other Topics Concern   Not on file  Social History Narrative   Not on file   Social Determinants of Health   Financial Resource Strain: Low Risk  (01/31/2023)   Overall Financial Resource Strain (CARDIA)    Difficulty of Paying Living Expenses: Not hard at all  Food Insecurity: No Food Insecurity (01/31/2023)   Hunger Vital Sign    Worried About Running Out of Food in the Last Year: Never true    Ran Out of Food in the Last Year: Never true  Transportation Needs: No Transportation Needs (01/31/2023)   PRAPARE - Administrator, Civil Service (Medical): No    Lack of Transportation (Non-Medical): No  Physical Activity: Inactive (01/31/2023)  Exercise Vital Sign    Days of Exercise per Week: 0 days    Minutes of Exercise per Session: 0 min  Stress: No Stress Concern Present (01/31/2023)   [employer] of Occupational Health - Occupational Stress Questionnaire    Feeling of Stress : Not at all  Social Connections: Moderately Isolated (01/31/2023)   Social Connection and Isolation Panel [NHANES]    Frequency of Communication with Friends and Family: More than three times a week    Frequency of Social Gatherings with Friends and Family: More than three times a week    Attends Religious Services: More than 4 times per year    Active Member of [employer] or Organizations: No    Attends Banker Meetings: Never    Marital Status: Never married  Intimate Partner Violence: Not  At Risk (01/31/2023)   Humiliation, Afraid, Rape, and Kick questionnaire    Fear of Current or Ex-Partner: No    Emotionally Abused: No    Physically Abused: No    Sexually Abused: No   Family Status  Relation Name Status   Mother  Deceased   Father  Alive   Sister  Alive   Brother  Alive   MGM  Deceased   MGF  Deceased   PGM  Deceased   PGF  Deceased   Son  (Not Specified)   Family History  Problem Relation Age of Onset   Early death Mother        while giving child birth   Lung disease Father    Asthma Sister    Thrombosis Sister    Other Maternal Grandmother        unknown medical history   Other Maternal Grandfather        unknown medical history   Stomach cancer Paternal Grandmother    Other Paternal Grandfather        unknown medical history   Asthma Son    No Known Allergies    Review of Systems  HENT: Negative.    Eyes: Negative.   Respiratory: Negative.    Cardiovascular: Negative.   Gastrointestinal: Negative.   Genitourinary: Negative.   Musculoskeletal:  Positive for joint pain.  Skin: Negative.   Neurological: Negative.   Psychiatric/Behavioral: Negative.        Objective:     BP 106/73   Pulse 77   Ht 5\' 3"  (1.6 m)   Wt 161 lb (73 kg)   LMP 01/12/2023 (Exact Date)   SpO2 98%   BMI 28.52 kg/m  BP Readings from Last 3 Encounters:  01/31/23 106/73  11/30/22 96/63  11/15/22 100/66   Wt Readings from Last 3 Encounters:  01/31/23 161 lb (73 kg)  11/30/22 162 lb 6.4 oz (73.7 kg)  11/15/22 159 lb 3.2 oz (72.2 kg)      Physical Exam HENT:     Head: Normocephalic.     Mouth/Throat:     Mouth: Mucous membranes are moist.  Eyes:     Pupils: Pupils are equal, round, and reactive to light.  Cardiovascular:     Rate and Rhythm: Normal rate and regular rhythm.     Pulses: Normal pulses.     Heart sounds: Normal heart sounds.  Pulmonary:     Effort: Pulmonary effort is normal.     Breath sounds: Normal breath sounds.  Abdominal:      General: Abdomen is flat.     Palpations: Abdomen is soft.  Musculoskeletal:  General: Tenderness (mild with palpation to right knee) present. No signs of injury.  Skin:    General: Skin is warm.  Neurological:     General: No focal deficit present.     Mental Status: She is alert.      No results found for any visits on 01/31/23.  Last CBC Lab Results  Component Value Date   WBC 6.5 01/18/2023   HGB 11.7 01/18/2023   HCT 35.5 01/18/2023   MCV 87 01/18/2023   MCH 28.6 01/18/2023   RDW 13.0 01/18/2023   PLT 389 01/18/2023   Last metabolic panel Lab Results  Component Value Date   GLUCOSE 95 01/18/2023   NA 140 01/18/2023   K 4.3 01/18/2023   CL 104 01/18/2023   CO2 24 01/18/2023   BUN 13 01/18/2023   CREATININE 0.68 01/18/2023   EGFR 116 01/18/2023   CALCIUM 9.2 01/18/2023   PROT 7.4 01/18/2023   ALBUMIN 4.3 01/18/2023   LABGLOB 3.1 01/18/2023   AGRATIO 1.4 02/02/2022   BILITOT 0.3 01/18/2023   ALKPHOS 59 01/18/2023   AST 15 01/18/2023   ALT 11 01/18/2023   ANIONGAP 9 09/13/2020   Last lipids Lab Results  Component Value Date   CHOL 228 (H) 01/18/2023   HDL 54 01/18/2023   LDLCALC 159 (H) 01/18/2023   TRIG 87 01/18/2023   CHOLHDL 4.2 01/18/2023   Last hemoglobin A1c Lab Results  Component Value Date   HGBA1C 5.4 01/18/2023   Last thyroid functions No results found for: "TSH", "T3TOTAL", "T4TOTAL", "THYROIDAB" Last vitamin D No results found for: "25OHVITD2", "25OHVITD3", "VD25OH" Last vitamin B12 and Folate No results found for: "VITAMINB12", "FOLATE"    The ASCVD Risk score (Arnett DK, et al., 2019) failed to calculate for the following reasons:   The 2019 ASCVD risk score is only valid for ages 27 to 81    Assessment & Plan:    1. Elevated lipids She will continue on current medication, low fat/cholesterol diet and exercise as tolerated. Will recheck Lipid in 4 weeks. - pravastatin (PRAVACHOL) 10 MG tablet; Take 1 tablet (10 mg  total) by mouth daily.  2. Acute pain of right knee -Unknown etiology of right knee pain, was started on Lidocaine ointment, advised to notify clinic for worsening symptoms. - lidocaine (XYLOCAINE) 5 % ointment; Apply 1 Application topically as needed.  Dispense: 35.44 g; Refill: 0    Return in about 4 weeks (around 02/28/2023), or if symptoms worsen or fail to improve.   Lutricia Horsfall, RN

## 2023-01-31 NOTE — Patient Instructions (Signed)
Plan de alimentacin cardiosaludable Heart-Healthy Eating Plan Llevar una dieta saludable es importante para la salud del corazn. Un plan de alimentacin cardiosaludable incluye: Consumir menos grasas no saludables. Consumir ms grasas saludables. Consumir menos sal en los alimentos. La sal tambin se denomina sodio. Realizar otros cambios en su dieta. Hable con el mdico o con un especialista en alimentacin (nutricionista) para crear un plan de alimentacin que sea adecuado para usted. En qu consiste el plan? El mdico puede recomendarle un plan de alimentacin que incluya lo siguiente: Grasas totales: ______% o menos del total de caloras por da. Grasas saturadas: ______% o menos del total de caloras por da. Colesterol: menos de _________mg por da. Sodio: menos de ________ mg al da. Cules son algunos consejos para seguir este plan? Al cocinar Evite frer los alimentos. En cambio, trate de cocinarlos en el horno, en la plancha o en la parrilla, o hervirlos. Tambin puede reducir las grasas de la siguiente forma: Quite la piel de las aves. Quite todas las grasas visibles de las carnes. Cocine al vapor las verduras en agua o caldo. Planificacin de las comidas  En las comidas, divida su plato en cuatro partes iguales: Llene la mitad del plato con verduras y ensaladas de hojas verdes. Llene un cuarto del plato con cereales integrales. Llene un cuarto del plato con alimentos con protenas magras. De 2 a 4 tazas de verduras por da. Una taza de verduras es: 1 taza (91 g) de brcoli o coliflor. 2 zanahorias medianas. 1 pimiento morrn grande. 1 batata grande. 1 tomate grande. 1 papa blanca mediana. 2 tazas (150 g) de verduras de hojas verdes crudas. Coma de 1 a 2 tazas de fruta cada da. Una taza de fruta es: 1 manzana pequea. 1 banana grande 1 taza (237 g) de fruta mezclada, 1 naranja grande,  taza (82 g) de frutas disecadas. 1 taza (240 ml) de jugo 100 % de  frutas. Consuma ms alimentos que tengan fibra soluble. Ellos son las manzanas, el brcoli, las zanahorias, los frijoles, los guisantes y la cebada. Trate de consumir de 20 a 30 g de fibra por da. Coma 4 o 5 porciones de frutos secos, legumbres y semillas por semana: 1 porcin de frijoles o legumbres secos equivale a  de taza (90 g) cocinados. 1 porcin de frutos secos es  oz (12 almendras, 24 pistachos o 7 mitades de nueces). 1 porcin de semillas equivale a  oz (8 g). Informacin general Coma ms comidas caseras. Coma menos comida de restaurantes, bufs y comida rpida. Limite o evite el alcohol. Limite los alimentos con alto contenido de almidn y azcar. Evite las comidas fritas. Baje de peso si es necesario. Intente llevar un registro de la cantidad de sal (sodio) que ingiere. Esto es importante si tiene presin arterial alta. Pdale a su mdico que le d ms informacin al respecto. Trate de incorporar comidas vegetarianas cada semana. Grasas Elija las grasas saludables. Estas incluyen aceite de oliva y de canola, semillas de lino, nueces, almendras y semillas. Consuma ms grasas omega-3. Estas incluyen salmn, caballa, sardinas, atn, aceite de lino y semillas de lino molidas. Intente comer pescado al menos dos veces por semana. Lea las etiquetas de los alimentos. Evite los alimentos que contienen grasas trans o altas cantidades de grasas saturadas. Limite el consumo de grasas saturadas. Estas se encuentran frecuentemente en productos de origen animal, como carnes, mantequilla y crema. Tambin se encuentran en alimentos de origen vegetal, como el aceite de palma, el aceite de   palmiste y el aceite de coco. Evite los alimentos con aceites parcialmente hidrogenados. Estos tienen grasas trans. Entre los ejemplos, se incluyen margarinas en barra, algunas margarinas untables, galletas dulces y saladas y otros productos horneados. Qu alimentos debo comer? Frutas Frutas frescas, en  conserva (en su jugo natural) o frutas congeladas. Verduras Verduras frescas o congeladas (crudas, al vapor, asadas o grilladas). Ensaladas de hojas verdes. Cereales La mayora de los cereales. Elija casi siempre trigo integral y cereales integrales. Arroz y pastas, incluido el arroz integral y las pastas elaboradas con trigo integral. Carnes y otras protenas Carnes magras de res, ternera, cerdo y cordero a las que se les haya quitado la grasa visible. Pollo y pavo sin piel. Todos los pescados y mariscos. Pato salvaje, conejo, faisn y venado. Claras de huevo o sustitutos del huevo bajos en colesterol. Porotos, guisantes y lentejas secos y tofu. Semillas y la mayora de los frutos secos. Lcteos Quesos descremados y semidescremados, entre ellos, ricota y mozzarella. Leche descremada o al 1 % que sea lquida, en polvo o evaporada. Suero de leche elaborado con leche semidescremada. Yogur descremado o semidescremado. Grasas y aceites Margarinas no hidrogenadas (sin grasas trans). Aceites vegetales, incluido el de soja, ssamo, girasol, oliva, man, crtamo, maz, canola y semillas de algodn. Alios para ensalada o mayonesa elaborados con aceite vegetal. Bebidas Agua mineral. T y caf. Gaseosas dietticas. Dulces y postres Sorbete, gelatina y helado de frutas. Pequeas cantidades de chocolate amargo. Limite todos los dulces y postres. Alios y condimentos Todos los alios y condimentos. Es posible que los productos que se enumeran ms arriba no constituyan una lista completa de los alimentos y las bebidas que puede tomar. Consulte a un nutricionista para conocer ms opciones. Qu alimentos debo evitar? Frutas Fruta enlatada en almbar espeso. Frutas con salsa de crema o mantequilla. Frutas fritas. Limite el consumo de coco. Verduras Verduras cocinadas con salsas de queso, crema o mantequilla. Verduras fritas. Cereales Panes elaborados con grasas saturadas o trans, aceites o leche entera.  Croissants. Panecillos dulces. Rosquillas. Galletas con alto contenido de grasas, como las que contienen queso. Carnes y otras protenas Carnes grasas, como perros calientes, costillas de res, salchichas, tocino, asado de costillar o chuletn. Fiambres con alto contenido de grasas, como salame y mortadela. Caviar. Pato y ganso domsticos. Vsceras, como hgado. Lcteos Crema, crema agria, queso crema y queso cottage con crema. Quesos enteros. Leche entera o al 2 % que sea lquida, evaporada o condensada. Suero de leche entero. Salsa de crema o queso con alto contenido de grasas. Yogur elaborado con leche entera. Grasas y aceites Grasa de carne o materia grasa. Manteca de cacao, aceites hidrogenados, aceite de palma, aceite de coco, aceite de palmiste. Grasas y materias grasas slidas, incluida la grasa del tocino, el cerdo salado, la manteca de cerdo y la mantequilla. Sustitutos no lcteos de la crema. Aderezos para ensalada con queso o crema agria. Bebidas Refrescos regulares y jugos con agregado de azcar. Dulces y postres Glaseados. Pudin. Galletas dulces. Bizcochuelos. Pasteles. Chocolate con leche o chocolate blanco. Almbares con mantequilla. Helados o bebidas elaboradas con helado con alto contenido de grasas. Esta no es una lista completa de los alimentos y las bebidas que se deben evitar. Consulte a un nutricionista para obtener ms informacin. Resumen La planificacin de las comidas cardiosaludables incluye comer menos grasas poco saludables, comer ms grasas saludables y hacer otros cambios en su dieta. Siga una dieta equilibrada. Esta incluye frutas y verduras, productos lcteos descremados o semidescremados, protenas magras,   frutos secos y legumbres, cereales integrales y aceites y grasas cardiosaludables. Esta informacin no tiene como fin reemplazar el consejo del mdico. Asegrese de hacerle al mdico cualquier pregunta que tenga. Document Revised: 08/31/2021 Document Reviewed:  08/31/2021 Elsevier Patient Education  2024 [employer]  

## 2023-02-16 ENCOUNTER — Other Ambulatory Visit: Payer: Self-pay

## 2023-03-01 ENCOUNTER — Ambulatory Visit: Payer: Self-pay | Admitting: Gerontology

## 2023-03-08 ENCOUNTER — Encounter: Payer: Self-pay | Admitting: Gerontology

## 2023-03-08 ENCOUNTER — Ambulatory Visit: Payer: Self-pay | Admitting: Gerontology

## 2023-03-08 VITALS — BP 102/70 | HR 66 | Ht 63.0 in | Wt 158.8 lb

## 2023-03-08 DIAGNOSIS — E785 Hyperlipidemia, unspecified: Secondary | ICD-10-CM

## 2023-03-08 DIAGNOSIS — G8929 Other chronic pain: Secondary | ICD-10-CM

## 2023-03-08 NOTE — Progress Notes (Signed)
Established Patient Office Visit  Subjective   Patient ID: Jordan Mccarthy, female    DOB: 02-05-1986  Age: 37 y.o. MRN: [id number]  Chief Complaint  Patient presents with   Follow-up    HPI  Jordan Mccarthy is a 37 year old female with the history  elevated LDL who presents for routine follow up visit. She stated that she is compliant with her medication and makes a healthy lifestyle choices, and takes a walk as part of her exercise.she denies any side effect from the medication.  She also complained of non traumatic right knee pain that radiates around the knee. She said that the pain gets to 7/10 on pain score at it's peak. She states that the pain is intermittent, cold weather and driving makes it worse. She takes Ibuprofen  as needed which gives her some relieve. She states that she is overall doing well and made no further compliant.  Review of Systems  Constitutional: Negative.   Eyes: Negative.   Respiratory: Negative.    Cardiovascular: Negative.   Gastrointestinal: Negative.   Genitourinary: Negative.   Musculoskeletal:  Positive for joint pain (Right knee swelling and pain.).  Skin: Negative.   Neurological: Negative.   Psychiatric/Behavioral: Negative.        Objective:s     BP 102/70   Pulse 66   Ht 5\' 3"  (1.6 m)   Wt 158 lb 12.8 oz (72 kg)   LMP 02/10/2023   SpO2 98%   BMI 28.13 kg/m  BP Readings from Last 3 Encounters:  03/08/23 102/70  01/31/23 106/73  11/30/22 96/63   Wt Readings from Last 3 Encounters:  03/08/23 158 lb 12.8 oz (72 kg)  01/31/23 161 lb (73 kg)  11/30/22 162 lb 6.4 oz (73.7 kg)      Physical Exam HENT:     Head: Normocephalic.  Cardiovascular:     Rate and Rhythm: Normal rate and regular rhythm.  Abdominal:     General: Bowel sounds are normal.  Musculoskeletal:        General: Normal range of motion.     Cervical back: Normal range of motion.  Skin:    General: Skin is warm and dry.  Neurological:     Mental  Status: She is alert and oriented to person, place, and time.  Psychiatric:        Mood and Affect: Mood normal.      No results found for any visits on 03/08/23.  Last CBC Lab Results  Component Value Date   WBC 6.5 01/18/2023   HGB 11.7 01/18/2023   HCT 35.5 01/18/2023   MCV 87 01/18/2023   MCH 28.6 01/18/2023   RDW 13.0 01/18/2023   PLT 389 01/18/2023   Last metabolic panel Lab Results  Component Value Date   GLUCOSE 95 01/18/2023   NA 140 01/18/2023   K 4.3 01/18/2023   CL 104 01/18/2023   CO2 24 01/18/2023   BUN 13 01/18/2023   CREATININE 0.68 01/18/2023   EGFR 116 01/18/2023   CALCIUM 9.2 01/18/2023   PROT 7.4 01/18/2023   ALBUMIN 4.3 01/18/2023   LABGLOB 3.1 01/18/2023   AGRATIO 1.4 02/02/2022   BILITOT 0.3 01/18/2023   ALKPHOS 59 01/18/2023   AST 15 01/18/2023   ALT 11 01/18/2023   ANIONGAP 9 09/13/2020   Last lipids Lab Results  Component Value Date   CHOL 228 (H) 01/18/2023   HDL 54 01/18/2023   LDLCALC 159 (H) 01/18/2023   TRIG 87  01/18/2023   CHOLHDL 4.2 01/18/2023   Last hemoglobin A1c Lab Results  Component Value Date   HGBA1C 5.4 01/18/2023   Last thyroid functions No results found for: "TSH", "T3TOTAL", "T4TOTAL", "THYROIDAB" Last vitamin D No results found for: "25OHVITD2", "25OHVITD3", "VD25OH" Last vitamin B12 and Folate No results found for: "VITAMINB12", "FOLATE"    The ASCVD Risk score (Arnett DK, et al., 2019) failed to calculate for the following reasons:   The 2019 ASCVD risk score is only valid for ages 6 to 61    Assessment & Plan:  1. Elevated lipids She is not compliant with her Pravachol, she was advised to take her medication, as prescribed, exercise and continue on low fat diet.  -Pravastatin (Pravaachol) 10 mg tablet daily. - Lipid Profile; Future  2. Chronic pain of right knee She was advised to continue  on Ibuprofen as needed and contact the clinic if the symptom gets worse or is not improving.    Follow  up  with the clinic in about 8 weeks  (around 06/09/23), or  if the symptoms worsen or does not improve.    Mickie Hillier, FNP

## 2023-03-08 NOTE — Patient Instructions (Signed)
Plan de alimentacin cardiosaludable Heart-Healthy Eating Plan Llevar una dieta saludable es importante para la salud del corazn. Un plan de alimentacin cardiosaludable incluye: Consumir menos grasas no saludables. Consumir ms grasas saludables. Consumir menos sal en los alimentos. La sal tambin se denomina sodio. Realizar otros cambios en su dieta. Hable con el mdico o con un especialista en alimentacin (nutricionista) para crear un plan de alimentacin que sea adecuado para usted. En qu consiste el plan? El mdico puede recomendarle un plan de alimentacin que incluya lo siguiente: Grasas totales: ______% o menos del total de caloras por da. Grasas saturadas: ______% o menos del total de caloras por da. Colesterol: menos de _________mg Jordan Mccarthy. Sodio: menos de ________ mg al Jordan Mccarthy. Cules son algunos consejos para seguir este plan? Al cocinar Evite frer los alimentos. En cambio, trate de cocinarlos en el horno, en la plancha o en la parrilla, o hervirlos. Tambin puede reducir las grasas de la siguiente forma: Quite la piel de las aves. Quite todas las grasas visibles de las carnes. Cocine al vapor las verduras en agua o caldo. Planificacin de las comidas  En las comidas, Jordan Mccarthy su plato en cuatro partes iguales: Llene la mitad del plato con verduras y ensaladas de hojas verdes. Llene un cuarto del plato con cereales integrales. Llene un cuarto del plato con alimentos con protenas magras. De 2 a 4 tazas de Jordan Mccarthy. Una taza de verduras es: 1 taza (91 g) de brcoli o coliflor. 2 zanahorias medianas. 1 pimiento morrn grande. 1 batata grande. 1 tomate grande. 1 papa blanca mediana. 2 tazas (150 g) de verduras de hojas verdes crudas. Coma de 1 a 2 tazas de Jordan Mccarthy. Una taza de fruta es: 1 manzana pequea. 1 banana grande 1 taza (237 g) de fruta mezclada, 1 naranja grande,  taza (82 g) de frutas disecadas. 1 taza (240 ml) de jugo 100 % de  frutas. Consuma ms alimentos que tengan fibra soluble. Ellos son las Jordan Mccarthy, el brcoli, las Jordan Mccarthy, los frijoles, los guisantes y Jordan Mccarthy. Trate de consumir de 20 a 30 g de Jordan Mccarthy. Coma 4 o 5 porciones de frutos secos, legumbres y semillas por semana: 1 porcin de frijoles o legumbres secos equivale a  de taza (90 g) cocinados. 1 porcin de frutos secos es  oz (12 almendras, 24 pistachos o 7 mitades de nueces). 1 porcin de semillas equivale a  oz (8 g). Informacin general Coma ms comidas caseras. Coma menos comida de restaurantes, bufs y comida rpida. Limite o evite el alcohol. Limite los alimentos con alto contenido de almidn y Location manager. Evite las comidas fritas. Baje de peso si es necesario. Intente llevar un registro de la cantidad de sal (sodio) que ingiere. Esto es importante si tiene presin arterial alta. Pdale a su mdico que le d ms informacin al respecto. Trate de incorporar comidas vegetarianas cada semana. Jordan Mccarthy grasas saludables. Estas incluyen aceite de oliva y de canola, semillas de lino, nueces, almendras y semillas. Consuma ms grasas omega-3. Estas incluyen salmn, caballa, sardinas, atn, aceite de lino y semillas de lino molidas. Intente comer pescado al Jordan Mccarthy veces por semana. Lea las etiquetas de los alimentos. Evite los alimentos que contienen grasas trans o altas cantidades de grasas saturadas. Limite el consumo de grasas saturadas. Estas se encuentran frecuentemente en productos de origen animal, como carnes, mantequilla y crema. Tambin se encuentran en alimentos de origen vegetal, como el aceite de palma, el aceite de  palmiste y el aceite de Mccarthy. Evite los alimentos con aceites parcialmente hidrogenados. Estos tienen grasas trans. Entre los ejemplos, se incluyen margarinas en barra, algunas margarinas untables, galletas dulces y saladas y otros productos horneados. Qu alimentos debo comer? Frutas Frutas frescas, en  conserva (en su jugo natural) o frutas congeladas. Verduras Verduras frescas o congeladas (crudas, al vapor, asadas o grilladas). Ensaladas de hojas verdes. Cereales La mayora de los cereales. Elija casi siempre trigo integral y cereales integrales. Arroz y pastas, incluido el arroz integral y las pastas elaboradas con trigo integral. Carnes y otras protenas Carnes magras de res, ternera, cerdo y cordero a las que se les haya quitado la grasa visible. Pollo y pavo sin piel. Todos los pescados y mariscos. Pato salvaje, conejo, faisn y venado. Claras de huevo o sustitutos del huevo bajos en colesterol. Porotos, guisantes y lentejas secos y tofu. Semillas y la mayora de los frutos secos. Lcteos Quesos descremados y semidescremados, entre ellos, ricota y mozzarella. Leche descremada o al 1 % que sea lquida, en polvo o evaporada. Suero de leche elaborado con leche semidescremada. Yogur descremado o semidescremado. Grasas y aceites Margarinas no hidrogenadas (sin grasas trans). Aceites vegetales, incluido el de soja, ssamo, girasol, oliva, man, crtamo, maz, canola y semillas de algodn. Alios para ensalada o mayonesa elaborados con aceite vegetal. Bebidas Agua mineral. T y caf. Gaseosas dietticas. Dulces y postres Sorbete, gelatina y helado de frutas. Pequeas cantidades de chocolate amargo. Limite todos los dulces y postres. Alios y condimentos Todos los alios y condimentos. Es posible que los productos que se enumeran ms arriba no constituyan una lista completa de los alimentos y las bebidas que puede tomar. Consulte a un nutricionista para conocer ms opciones. Qu alimentos debo evitar? Frutas Fruta enlatada en almbar espeso. Frutas con salsa de crema o mantequilla. Frutas fritas. Limite el consumo de Mccarthy. Verduras Verduras cocinadas con salsas de queso, crema o mantequilla. Verduras fritas. Cereales Panes elaborados con grasas saturadas o trans, aceites o leche entera.  Croissants. Panecillos dulces. Rosquillas. Galletas con alto contenido de grasas, como las que contienen queso. Carnes y otras protenas Carnes grasas, como perros calientes, costillas de res, salchichas, tocino, asado de costillar o chuletn. Fiambres con alto contenido de grasas, como salame y mortadela. Caviar. Pato y ganso domsticos. Vsceras, como hgado. Lcteos Crema, crema agria, queso crema y queso cottage con crema. Quesos enteros. Leche entera o al 2 % que sea lquida, evaporada o condensada. Suero de leche entero. Salsa de crema o queso con alto contenido de grasas. Yogur elaborado con leche entera. Grasas y aceites Grasa de carne o materia grasa. Manteca de cacao, aceites hidrogenados, aceite de palma, aceite de Mccarthy, aceite de palmiste. Grasas y materias grasas slidas, incluida la grasa del tocino, el cerdo salado, la manteca de cerdo y la mantequilla. Sustitutos no lcteos de la crema. Aderezos para ensalada con queso o crema agria. Bebidas Refrescos regulares y jugos con agregado de azcar. Dulces y postres Glaseados. Pudin. Galletas dulces. Bizcochuelos. Pasteles. Chocolate con leche o chocolate blanco. Almbares con mantequilla. Helados o bebidas elaboradas con helado con alto contenido de grasas. Esta no es una lista completa de los alimentos y las bebidas que se deben evitar. Consulte a un nutricionista para obtener ms informacin. Resumen La planificacin de las comidas cardiosaludables incluye comer menos grasas poco saludables, comer ms grasas saludables y hacer otros cambios en su dieta. Siga una dieta equilibrada. Esta incluye frutas y verduras, productos lcteos descremados o semidescremados, protenas magras,   frutos secos y legumbres, cereales integrales y aceites y grasas cardiosaludables. Esta informacin no tiene Theme park manager el consejo del mdico. Asegrese de hacerle al mdico cualquier pregunta que tenga. Document Revised: 08/31/2021 Document Reviewed:  08/31/2021 Elsevier Patient Education  2024 Jordan Mccarthy.

## 2023-05-02 ENCOUNTER — Other Ambulatory Visit: Payer: Self-pay

## 2023-05-02 DIAGNOSIS — E785 Hyperlipidemia, unspecified: Secondary | ICD-10-CM

## 2023-05-03 LAB — LIPID PANEL
Chol/HDL Ratio: 4.7 {ratio} — ABNORMAL HIGH (ref 0.0–4.4)
Cholesterol, Total: 234 mg/dL — ABNORMAL HIGH (ref 100–199)
HDL: 50 mg/dL (ref >39)
LDL Chol Calc (NIH): 164 mg/dL — ABNORMAL HIGH (ref 0–99)
Triglycerides: 111 mg/dL (ref 0–149)
VLDL Cholesterol Cal: 20 mg/dL (ref 5–40)

## 2023-05-09 ENCOUNTER — Other Ambulatory Visit: Payer: Self-pay

## 2023-05-09 ENCOUNTER — Encounter: Payer: Self-pay | Admitting: Gerontology

## 2023-05-09 ENCOUNTER — Ambulatory Visit: Payer: Self-pay | Admitting: Gerontology

## 2023-05-09 VITALS — BP 111/79 | HR 80 | Ht 62.0 in | Wt 158.1 lb

## 2023-05-09 DIAGNOSIS — R4589 Other symptoms and signs involving emotional state: Secondary | ICD-10-CM | POA: Insufficient documentation

## 2023-05-09 DIAGNOSIS — E785 Hyperlipidemia, unspecified: Secondary | ICD-10-CM

## 2023-05-09 DIAGNOSIS — R5383 Other fatigue: Secondary | ICD-10-CM

## 2023-05-09 MED ORDER — PRAVASTATIN SODIUM 10 MG PO TABS
10.0000 mg | ORAL_TABLET | Freq: Every day | ORAL | 2 refills | Status: DC
Start: 2023-05-09 — End: 2023-05-16
  Filled 2023-05-09: qty 30, 30d supply, fill #0

## 2023-05-09 NOTE — Progress Notes (Unsigned)
Established Patient Office Visit  Subjective   Patient ID: Jordan Mccarthy, female    DOB: 1985/12/25  Age: 37 y.o. MRN: [id number]    HPI  Jordan Mccarthy is a 37 year old female with a history of elevated LDL who presents for routine follow up visit and lab review.  Her labs checked on 05/02/23, total cholesterol increased from 228mg /dL to 234mg /dL, LDL increased from 159mg /dL to 164mg /dL, and Cholesterol/HDL ratio from 4.2 to 4.7.  She is prescribed Pravastatin 10mg  daily but reports she has not been taking this medication and has not picked up the prescription. She denies pain and reports resolution of the right knee pain she was previously experiencing.  She denies depression but reports low energy, lack of motivation, and wakes daily with increased feelings of tiredness.  She denies suicidal ideation, emotional or physical abuse and reports she feels safe at home.  Overall she reports she is doing well and offers no further complaints.   PHQ-9=9 GAD 7=9  Patient Active Problem List   Diagnosis Date Noted   Sad mood 05/09/2023   Right knee pain 01/31/2023   Itchy eyes 10/18/2022   Elevated lipids 03/02/2022   Dental caries 02/23/2020   Overweight 08/13/2019   Past Medical History:  Diagnosis Date   Anemia    Encounter to establish care 02/02/2022   Hemorrhoid    dx 7 yrs ago per pt.   Hyperlipidemia    Past Surgical History:  Procedure Laterality Date   NO PAST SURGERIES     Family History  Problem Relation Age of Onset   Early death Mother        while giving child birth   Lung disease Father    Asthma Sister    Thrombosis Sister    Other Maternal Grandmother        unknown medical history   Other Maternal Grandfather        unknown medical history   Stomach cancer Paternal Grandmother    Other Paternal Grandfather        unknown medical history   Asthma Son    No Known Allergies    Review of Systems  Constitutional: Negative.   HENT: Negative.     Eyes:        Dry eyes  Respiratory: Negative.    Cardiovascular: Negative.   Gastrointestinal: Negative.   Genitourinary: Negative.   Musculoskeletal: Negative.   Neurological: Negative.   Endo/Heme/Allergies: Negative.   Psychiatric/Behavioral:         Reports low energy, lack of motivation, and wakes with increased tiredness in the morning      Objective:     BP 111/79   Pulse 80   Ht 5\' 2"  (1.575 m)   Wt 158 lb 1.6 oz (71.7 kg)   LMP 05/09/2023   SpO2 99%   BMI 28.92 kg/m  BP Readings from Last 3 Encounters:  05/09/23 111/79  03/08/23 102/70  01/31/23 106/73   Wt Readings from Last 3 Encounters:  05/09/23 158 lb 1.6 oz (71.7 kg)  03/08/23 158 lb 12.8 oz (72 kg)  01/31/23 161 lb (73 kg)      Physical Exam Constitutional:      Appearance: Normal appearance.  HENT:     Head: Normocephalic and atraumatic.  Cardiovascular:     Rate and Rhythm: Normal rate and regular rhythm.     Pulses: Normal pulses.     Heart sounds: Normal heart sounds.  Pulmonary:     Effort:  Pulmonary effort is normal.     Breath sounds: Normal breath sounds.  Abdominal:     General: Bowel sounds are normal.  Musculoskeletal:        General: Normal range of motion.  Skin:    General: Skin is warm and dry.  Neurological:     General: No focal deficit present.     Mental Status: She is alert and oriented to person, place, and time.  Psychiatric:        Behavior: Behavior normal.        Thought Content: Thought content normal.        Judgment: Judgment normal.     Comments: Flat affect       No results found for any visits on 05/09/23.  Last CBC Lab Results  Component Value Date   WBC 6.5 01/18/2023   HGB 11.7 01/18/2023   HCT 35.5 01/18/2023   MCV 87 01/18/2023   MCH 28.6 01/18/2023   RDW 13.0 01/18/2023   PLT 389 01/18/2023   Last metabolic panel Lab Results  Component Value Date   GLUCOSE 95 01/18/2023   NA 140 01/18/2023   K 4.3 01/18/2023   CL 104 01/18/2023    CO2 24 01/18/2023   BUN 13 01/18/2023   CREATININE 0.68 01/18/2023   EGFR 116 01/18/2023   CALCIUM 9.2 01/18/2023   PROT 7.4 01/18/2023   ALBUMIN 4.3 01/18/2023   LABGLOB 3.1 01/18/2023   AGRATIO 1.4 02/02/2022   BILITOT 0.3 01/18/2023   ALKPHOS 59 01/18/2023   AST 15 01/18/2023   ALT 11 01/18/2023   ANIONGAP 9 09/13/2020   Last lipids Lab Results  Component Value Date   CHOL 234 (H) 05/02/2023   HDL 50 05/02/2023   LDLCALC 164 (H) 05/02/2023   TRIG 111 05/02/2023   CHOLHDL 4.7 (H) 05/02/2023   Last hemoglobin A1c Lab Results  Component Value Date   HGBA1C 5.4 01/18/2023   Last thyroid functions No results found for: "TSH", "T3TOTAL", "T4TOTAL", "THYROIDAB" Last vitamin D No results found for: "25OHVITD2", "25OHVITD3", "VD25OH" Last vitamin B12 and Folate No results found for: "VITAMINB12", "FOLATE"    The ASCVD Risk score (Arnett DK, et al., 2019) failed to calculate for the following reasons:   The 2019 ASCVD risk score is only valid for ages 45 to 30    Assessment & Plan:   1. Fatigue, unspecified type - Will monitor labs for contributing factors.  Encouraged good sleep hygiene, nutrition, and stress management techniques.  Will follow up in clinic in 1 week.  - TSH; Future - VITAMIN D 25 Hydroxy (Vit-D Deficiency, Fractures); Future - CBC w/Diff; Future - Iron Binding Cap (TIBC)([facility]); Future - Iron Binding Cap (TIBC)([facility]) - CBC w/Diff - VITAMIN D 25 Hydroxy (Vit-D Deficiency, Fractures) - TSH  2. Elevated lipids -She is not compliant with her Pravastatin.  Educated her on the importance of taking her medication as prescribed as well as the dangers of continued elevated cholesterol.  Encouraged her to continue on a low fat diet and exercise as tolerated.  - pravastatin (PRAVACHOL) 10 MG tablet; Take 1 tablet (10 mg total) by mouth daily.  Dispense: 30 tablet; Refill: 2  3. Sad mood -She denies depression or suicidal ideation  but reports low energy, lack of motivation, and tiredness on waking daily.  PHQ-9 = 9 and GAD 7 = 9.  Will check her Vitamin D and TSH levels.  Will follow up and reassess in clinic in 1 week.  Encouraged  her to call the Crisis help line with worsening symptoms.   Return in about 1 week (around 05/16/2023).    Chioma Trellis Paganini, NP

## 2023-05-10 LAB — IRON AND TIBC
Iron Saturation: 14 % — ABNORMAL LOW (ref 15–55)
Iron: 55 ug/dL (ref 27–159)
Total Iron Binding Capacity: 380 ug/dL (ref 250–450)
UIBC: 325 ug/dL (ref 131–425)

## 2023-05-10 LAB — CBC WITH DIFFERENTIAL/PLATELET
Basophils Absolute: 0 10*3/uL (ref 0.0–0.2)
Basos: 1 %
EOS (ABSOLUTE): 0.2 10*3/uL (ref 0.0–0.4)
Eos: 2 %
Hematocrit: 36.6 % (ref 34.0–46.6)
Hemoglobin: 11.8 g/dL (ref 11.1–15.9)
Immature Grans (Abs): 0 10*3/uL (ref 0.0–0.1)
Immature Granulocytes: 0 %
Lymphocytes Absolute: 2.4 10*3/uL (ref 0.7–3.1)
Lymphs: 30 %
MCH: 28.9 pg (ref 26.6–33.0)
MCHC: 32.2 g/dL (ref 31.5–35.7)
MCV: 90 fL (ref 79–97)
Monocytes Absolute: 0.5 10*3/uL (ref 0.1–0.9)
Monocytes: 7 %
Neutrophils Absolute: 4.9 10*3/uL (ref 1.4–7.0)
Neutrophils: 60 %
Platelets: 395 10*3/uL (ref 150–450)
RBC: 4.09 x10E6/uL (ref 3.77–5.28)
RDW: 12.9 % (ref 11.7–15.4)
WBC: 8.1 10*3/uL (ref 3.4–10.8)

## 2023-05-10 LAB — VITAMIN D 25 HYDROXY (VIT D DEFICIENCY, FRACTURES): Vit D, 25-Hydroxy: 19.7 ng/mL — ABNORMAL LOW (ref 30.0–100.0)

## 2023-05-10 LAB — TSH: TSH: 0.624 u[IU]/mL (ref 0.450–4.500)

## 2023-05-16 ENCOUNTER — Ambulatory Visit: Payer: Self-pay | Admitting: Gerontology

## 2023-05-16 ENCOUNTER — Other Ambulatory Visit: Payer: Self-pay

## 2023-05-16 ENCOUNTER — Encounter: Payer: Self-pay | Admitting: Gerontology

## 2023-05-16 VITALS — BP 109/72 | HR 65 | Ht 63.0 in | Wt 158.1 lb

## 2023-05-16 DIAGNOSIS — E785 Hyperlipidemia, unspecified: Secondary | ICD-10-CM

## 2023-05-16 DIAGNOSIS — E559 Vitamin D deficiency, unspecified: Secondary | ICD-10-CM

## 2023-05-16 MED ORDER — PRAVASTATIN SODIUM 10 MG PO TABS
10.0000 mg | ORAL_TABLET | Freq: Every day | ORAL | 0 refills | Status: DC
  Filled 2023-05-16: qty 90, 90d supply, fill #0

## 2023-05-16 MED ORDER — VITAMIN D (ERGOCALCIFEROL) 1.25 MG (50000 UNIT) PO CAPS
50000.0000 [IU] | ORAL_CAPSULE | ORAL | 0 refills | Status: DC
Start: 2023-05-16 — End: 2023-08-21
  Filled 2023-05-16: qty 12, 84d supply, fill #0

## 2023-05-16 NOTE — Patient Instructions (Signed)
Deficiencia de vitamina D Vitamin D Deficiency La deficiencia de vitamina D ocurre cuando el organismo no tiene suficiente vitamina D. La vitamina D es importante por lo siguiente: Ayuda al organismo a usar ciertos minerales. Ayuda a Pharmacologist los [employer]. Disminuye la irritacin y la hinchazn (inflamacin). Ayuda al sistema de defensa del cuerpo (sistema inmunitario) a Geophysicist/field seismologist. La ingesta insuficiente de vitamina D puede hacer que los huesos se tornen blandos. Cules son las causas? Consumo insuficiente de alimentos que contienen vitamina D. No estar al sol lo suficiente. Tener enfermedades que le dificultan al cuerpo la absorcin de vitamina D. Haberse sometido a una [employer] se extirp [place] parte del estmago o del intestino delgado. Qu incrementa el riesgo? Ser un [employer]. No pasar mucho tiempo al [employer]. Vivir en un centro de atencin a Air cabin crew. Tener piel morena. Tomar ciertos medicamentos. Tener sobrepeso o mucho sobrepeso (obesidad). Tener una enfermedad renal o heptica a largo plazo (crnica). Cules son los signos o sntomas? En los casos leves, tal vez no haya sntomas. Si la afeccin es muy grave, los sntomas pueden incluir lo siguiente: [employer]. Dolor muscular. Imposibilidad de caminar con normalidad. Huesos que se fracturan con facilidad. Dolor en las articulaciones. Cmo se trata? El tratamiento puede incluir tomar suplementos como se lo haya indicado el mdico. El mdico le indicar la dosis ms conveniente para usted. Esto puede incluir tomar: Vitamina D. Calcio. Siga estas indicaciones en su casa: Comida y bebida Consuma alimentos que contengan vitamina D, tales como: Productos lcteos, cereales o jugos que contengan vitamina D agregada (estn fortificados). Controle las etiquetas. Pescados, como el salmn o la [place]. Huevos. La vitamina D est en la yema. Hongos tratados con luz UV. Hgado de res. Es  posible que los productos detallados arriba no constituyan una lista completa de los alimentos y las bebidas que puede tomar. Consulte a un nutricionista para obtener ms informacin. Indicaciones generales Use los medicamentos de venta libre y los recetados solamente como se lo haya indicado el mdico. Tome los suplementos solamente como se lo haya indicado el mdico. Reciba la luz solar de forma segura. No utilice camas solares. Mantenga un peso saludable. Pierda peso si lo necesita. Concurra a todas las visitas de seguimiento. Cmo se previene? Consuma alimentos que contengan naturalmente vitamina D. Coma o beba alimentos con agregado de vitamina D, como cereales, jugos y [place]. Tome vitamina D o un complejo multivitamnico que contenga vitamina D. Expngase al sol. El organismo produce vitamina D cuando la piel recibe a luz del sol. Comunquese con un mdico si: Los sntomas no desaparecen. Tiene ganas de vomitar (nuseas). Vomita. Defeca menos con menos frecuencia de lo habitual o tiene dificultad para defecar (estreimiento). Resumen La deficiencia de vitamina D ocurre cuando el organismo no tiene suficiente vitamina D. La vitamina D ayuda a mantener los [employer]. A menudo, esta afeccin se trata tomando un suplemento. El Firefighter la dosis ms conveniente para usted. Esta informacin no tiene Theme park manager el consejo del mdico. Asegrese de hacerle al mdico cualquier pregunta que tenga. Document Revised: 04/29/2021 Document Reviewed: 04/29/2021 Elsevier Patient Education  2024 [employer].

## 2023-05-16 NOTE — Progress Notes (Signed)
Established Patient Office Visit  Subjective   Patient ID: Jordan Mccarthy, female    DOB: 1986-02-25  Age: 37 y.o. MRN: [id number]    HPI  Jordan Mccarthy is a 37 year old female with a history of elevated LDL who presents for routine follow up visit and lab review.  She previously reported feelings of fatigue, low energy, and lack of motivation during her visit on 05/09/23 and she continues to endorse those same symptoms during her clinic visit today.  Labs checked last visit included a CBC, TSH, and Vitamin D level.  Her CBC and TSH results were within normal limit.  Her Vitamin D level shows deficiency at 19.7ng/mL.  She denies bone pain, muscle weakness, or feelings of depression.  She reports she is compliant with her medications, denies side effects, and continues to make healthy lifestyle changes.  Overall she is doing well and offers no further complaints.     Patient Active Problem List   Diagnosis Date Noted   Sad mood 05/09/2023   Right knee pain 01/31/2023   Itchy eyes 10/18/2022   Elevated lipids 03/02/2022   Dental caries 02/23/2020   Overweight 08/13/2019   Past Medical History:  Diagnosis Date   Anemia    Encounter to establish care 02/02/2022   Hemorrhoid    dx 7 yrs ago per pt.   Hyperlipidemia    Past Surgical History:  Procedure Laterality Date   NO PAST SURGERIES     Family History  Problem Relation Age of Onset   Early death Mother        while giving child birth   Lung disease Father    Asthma Sister    Thrombosis Sister    Other Maternal Grandmother        unknown medical history   Other Maternal Grandfather        unknown medical history   Stomach cancer Paternal Grandmother    Other Paternal Grandfather        unknown medical history   Asthma Son    No Known Allergies    Review of Systems  Constitutional:  Positive for malaise/fatigue.  HENT: Negative.    Eyes: Negative.   Respiratory: Negative.    Cardiovascular: Negative.    Gastrointestinal: Negative.   Genitourinary: Negative.   Musculoskeletal: Negative.   Skin: Negative.   Neurological: Negative.   Endo/Heme/Allergies: Negative.   Psychiatric/Behavioral: Negative.        Objective:     BP 109/72   Pulse 65   Ht 5\' 3"  (1.6 m)   Wt 158 lb 1.6 oz (71.7 kg)   LMP 05/09/2023   SpO2 98%   BMI 28.01 kg/m  BP Readings from Last 3 Encounters:  05/16/23 109/72  05/09/23 111/79  03/08/23 102/70   Wt Readings from Last 3 Encounters:  05/16/23 158 lb 1.6 oz (71.7 kg)  05/09/23 158 lb 1.6 oz (71.7 kg)  03/08/23 158 lb 12.8 oz (72 kg)      Physical Exam Constitutional:      Appearance: Normal appearance.  HENT:     Head: Normocephalic and atraumatic.  Cardiovascular:     Rate and Rhythm: Normal rate and regular rhythm.     Heart sounds: Normal heart sounds.  Pulmonary:     Effort: Pulmonary effort is normal.     Breath sounds: Normal breath sounds.  Abdominal:     General: Bowel sounds are normal.  Musculoskeletal:        General: Normal  range of motion.  Skin:    General: Skin is warm and dry.  Neurological:     General: No focal deficit present.     Mental Status: She is alert and oriented to person, place, and time.  Psychiatric:        Mood and Affect: Mood normal.        Behavior: Behavior normal.      No results found for any visits on 05/16/23.  Last CBC Lab Results  Component Value Date   WBC 8.1 05/09/2023   HGB 11.8 05/09/2023   HCT 36.6 05/09/2023   MCV 90 05/09/2023   MCH 28.9 05/09/2023   RDW 12.9 05/09/2023   PLT 395 05/09/2023   Last metabolic panel Lab Results  Component Value Date   GLUCOSE 95 01/18/2023   NA 140 01/18/2023   K 4.3 01/18/2023   CL 104 01/18/2023   CO2 24 01/18/2023   BUN 13 01/18/2023   CREATININE 0.68 01/18/2023   EGFR 116 01/18/2023   CALCIUM 9.2 01/18/2023   PROT 7.4 01/18/2023   ALBUMIN 4.3 01/18/2023   LABGLOB 3.1 01/18/2023   AGRATIO 1.4 02/02/2022   BILITOT 0.3  01/18/2023   ALKPHOS 59 01/18/2023   AST 15 01/18/2023   ALT 11 01/18/2023   ANIONGAP 9 09/13/2020   Last lipids Lab Results  Component Value Date   CHOL 234 (H) 05/02/2023   HDL 50 05/02/2023   LDLCALC 164 (H) 05/02/2023   TRIG 111 05/02/2023   CHOLHDL 4.7 (H) 05/02/2023   Last hemoglobin A1c Lab Results  Component Value Date   HGBA1C 5.4 01/18/2023   Last thyroid functions Lab Results  Component Value Date   TSH 0.624 05/09/2023   Last vitamin D Lab Results  Component Value Date   VD25OH 19.7 (L) 05/09/2023   Last vitamin B12 and Folate No results found for: "VITAMINB12", "FOLATE"    The ASCVD Risk score (Arnett DK, et al., 2019) failed to calculate for the following reasons:   The 2019 ASCVD risk score is only valid for ages 35 to 32    Assessment & Plan:   1. Elevated lipids -She will continue on current medication, continue a low fat/cholesterol diet, and exercise as tolerated.  - pravastatin (PRAVACHOL) 10 MG tablet; Take 1 tablet (10 mg total) by mouth daily.  Dispense: 90 tablet; Refill: 0 - Lipid panel; Future  2. Vitamin D deficiency -She will be started on Vitamin D capsules today.  She was educated about the medication and instructed to call the clinic if she has any issues.  She was encouraged to increase intake of vitamin D rich foods and increase weight-bearing exercises to support bone health.  - Vitamin D, Ergocalciferol, (DRISDOL) 1.25 MG (50000 UNIT) CAPS capsule; Take 1 capsule (50,000 Units total) by mouth every 7 (seven) days.  Dispense: 12 capsule; Refill: 0 - VITAMIN D 25 Hydroxy (Vit-D Deficiency, Fractures); Future    Return in about 3 months (around 08/16/2023), or if symptoms worsen or fail to improve.    Jordan Trellis Paganini, NP

## 2023-08-08 ENCOUNTER — Other Ambulatory Visit: Payer: Self-pay

## 2023-08-08 DIAGNOSIS — E559 Vitamin D deficiency, unspecified: Secondary | ICD-10-CM

## 2023-08-08 DIAGNOSIS — E785 Hyperlipidemia, unspecified: Secondary | ICD-10-CM

## 2023-08-09 LAB — LIPID PANEL
Chol/HDL Ratio: 5.1 {ratio} — ABNORMAL HIGH (ref 0.0–4.4)
Cholesterol, Total: 240 mg/dL — ABNORMAL HIGH (ref 100–199)
HDL: 47 mg/dL (ref >39)
LDL Chol Calc (NIH): 170 mg/dL — ABNORMAL HIGH (ref 0–99)
Triglycerides: 129 mg/dL (ref 0–149)
VLDL Cholesterol Cal: 23 mg/dL (ref 5–40)

## 2023-08-09 LAB — VITAMIN D 25 HYDROXY (VIT D DEFICIENCY, FRACTURES): Vit D, 25-Hydroxy: 58.8 ng/mL (ref 30.0–100.0)

## 2023-08-15 ENCOUNTER — Ambulatory Visit: Payer: Self-pay | Admitting: Gerontology

## 2023-08-17 ENCOUNTER — Ambulatory Visit: Payer: Self-pay

## 2023-08-17 VITALS — BP 112/57 | Ht 67.0 in | Wt 156.5 lb

## 2023-08-17 DIAGNOSIS — Z3041 Encounter for surveillance of contraceptive pills: Secondary | ICD-10-CM

## 2023-08-17 DIAGNOSIS — Z3009 Encounter for other general counseling and advice on contraception: Secondary | ICD-10-CM

## 2023-08-17 MED ORDER — NORGESTIM-ETH ESTRAD TRIPHASIC 0.18/0.215/0.25 MG-25 MCG PO TABS
1.0000 | ORAL_TABLET | Freq: Every day | ORAL | Status: DC
Start: 2023-08-17 — End: 2023-12-13

## 2023-08-17 NOTE — Progress Notes (Signed)
Client seen in nurse clinic for BCP refill.   Interpretation provided by Language Metta Clines [phone].  Client reports she has one week left in her current pack of pills, and reports that she has not missed any pills.  She reports taking then at the same time each day.    The patient was dispensed norgestimate-Ethinyl Estradiol Triphasic: #4 packs as ordered by Lenice Llamas, NP dated 11/30/2022 today. I provided counseling today regarding the medication. We discussed the medication, the side effects and when to call clinic. Patient given the opportunity to ask questions. Questions answered. Client due for annual exam 12/01/2023.  Reminder card given.  Last pack of pills labeled, and patient encourage to call for appointment as soon as she starts that last pack if not before.    Client with no questions.   Aubrei Bouchie Sherrilyn Rist, RN

## 2023-08-21 ENCOUNTER — Encounter: Payer: Self-pay | Admitting: Gerontology

## 2023-08-21 ENCOUNTER — Other Ambulatory Visit: Payer: Self-pay

## 2023-08-21 ENCOUNTER — Ambulatory Visit: Payer: Self-pay | Admitting: Gerontology

## 2023-08-21 VITALS — BP 108/69 | HR 70 | Ht 62.0 in | Wt 155.6 lb

## 2023-08-21 DIAGNOSIS — E559 Vitamin D deficiency, unspecified: Secondary | ICD-10-CM | POA: Insufficient documentation

## 2023-08-21 DIAGNOSIS — E785 Hyperlipidemia, unspecified: Secondary | ICD-10-CM

## 2023-08-21 MED ORDER — PRAVASTATIN SODIUM 20 MG PO TABS
20.0000 mg | ORAL_TABLET | Freq: Every day | ORAL | 2 refills | Status: DC
  Filled 2023-08-21: qty 30, 30d supply, fill #0
  Filled 2023-09-27: qty 30, 30d supply, fill #1
  Filled 2023-11-01: qty 30, 30d supply, fill #2

## 2023-08-21 NOTE — Progress Notes (Signed)
Established Patient Office Visit  Subjective   Patient ID: Jordan Mccarthy, female    DOB: [birth year]  Age: 38 y.o. MRN: [id number]  Chief Complaint  Patient presents with   Follow-up    HPI Jordan Mccarthy is a 38 year old female with a history of elevated LDL who presents for routine follow up visit and lab review. The patient's lipid panel that was obtained on 08/08/2023, total cholesterol was  240 mg/dL,  and LD was 782 mg/dl. Her Vitamin D level was 58.8 ng/dL. She reports strict adherence to her medication and denies medication side effects. She reports exercising 3 times a week. Overall, she states that she  is doing well and denies any additional complaints or concerns.  Patient Active Problem List   Diagnosis Date Noted   Vitamin D deficiency 08/21/2023   Sad mood 05/09/2023   Right knee pain 01/31/2023   Itchy eyes 10/18/2022   Elevated lipids 03/02/2022   Dental caries 02/23/2020   Overweight 08/13/2019   Past Medical History:  Diagnosis Date   Anemia    Encounter to establish care 02/02/2022   Hemorrhoid    dx 7 yrs ago per pt.   Hyperlipidemia    Past Surgical History:  Procedure Laterality Date   NO PAST SURGERIES     Social History   Tobacco Use   Smoking status: Never   Smokeless tobacco: Never  Vaping Use   Vaping status: Never Used  Substance Use Topics   Alcohol use: Not Currently    Alcohol/week: 2.0 standard drinks of alcohol    Types: 2 Cans of beer per week    Comment: last use 10/2019   Drug use: Never   Social History   Socioeconomic History   Marital status: Single    Spouse name: Ranulfo Guiterrez   Number of children: 3   Years of education: Not on file   Highest education level: 9th grade  Occupational History   Occupation: Unemployed  Tobacco Use   Smoking status: Never   Smokeless tobacco: Never  Vaping Use   Vaping status: Never Used  Substance and Sexual Activity   Alcohol use: Not Currently    Alcohol/week: 2.0  standard drinks of alcohol    Types: 2 Cans of beer per week    Comment: last use 10/2019   Drug use: Never   Sexual activity: Yes    Partners: Male    Birth control/protection: Pill, Injection    Comment: last sex approx 1.5 weeks ago  Other Topics Concern   Not on file  Social History Narrative   Not on file   Social Drivers of Health   Financial Resource Strain: Low Risk  (01/31/2023)   Overall Financial Resource Strain (CARDIA)    Difficulty of Paying Living Expenses: Not hard at all  Food Insecurity: No Food Insecurity (01/31/2023)   Hunger Vital Sign    Worried About Running Out of Food in the Last Year: Never true    Ran Out of Food in the Last Year: Never true  Transportation Needs: No Transportation Needs (01/31/2023)   PRAPARE - Administrator, Civil Service (Medical): No    Lack of Transportation (Non-Medical): No  Physical Activity: Inactive (01/31/2023)   Exercise Vital Sign    Days of Exercise per Week: 0 days    Minutes of Exercise per Session: 0 min  Stress: No Stress Concern Present (01/31/2023)   [employer] of Occupational Health - Occupational Stress Questionnaire  Feeling of Stress : Not at all  Social Connections: Moderately Isolated (01/31/2023)   Social Connection and Isolation Panel [NHANES]    Frequency of Communication with Friends and Family: More than three times a week    Frequency of Social Gatherings with Friends and Family: More than three times a week    Attends Religious Services: More than 4 times per year    Active Member of [employer] or Organizations: No    Attends Banker Meetings: Never    Marital Status: Never married  Intimate Partner Violence: Not At Risk (01/31/2023)   Humiliation, Afraid, Rape, and Kick questionnaire    Fear of Current or Ex-Partner: No    Emotionally Abused: No    Physically Abused: No    Sexually Abused: No   Family Status  Relation Name Status   Mother  Deceased   Father  Alive    Sister  Alive   Brother  Alive   MGM  Deceased   MGF  Deceased   PGM  Deceased   PGF  Deceased   Son  (Not Specified)  No partnership data on file   Family History  Problem Relation Age of Onset   Early death Mother        while giving child birth   Lung disease Father    Asthma Sister    Thrombosis Sister    Other Maternal Grandmother        unknown medical history   Other Maternal Grandfather        unknown medical history   Stomach cancer Paternal Grandmother    Other Paternal Grandfather        unknown medical history   Asthma Son    No Known Allergies    Review of Systems  Constitutional: Negative.   HENT: Negative.    Eyes: Negative.   Respiratory: Negative.    Cardiovascular: Negative.   Gastrointestinal: Negative.   Genitourinary: Negative.   Musculoskeletal: Negative.   Skin: Negative.   Neurological: Negative.   Psychiatric/Behavioral: Negative.        Objective:     BP 108/69 (BP Location: Right Arm, Patient Position: Sitting, Cuff Size: Small)   Pulse 70   Ht 5\' 2"  (1.575 m)   Wt 155 lb 9.6 oz (70.6 kg)   LMP 07/28/2023   SpO2 98%   BMI 28.46 kg/m  BP Readings from Last 3 Encounters:  08/21/23 108/69  08/17/23 (!) 112/57  05/16/23 109/72   Wt Readings from Last 3 Encounters:  08/21/23 155 lb 9.6 oz (70.6 kg)  08/17/23 156 lb 8 oz (71 kg)  05/16/23 158 lb 1.6 oz (71.7 kg)      Physical Exam HENT:     Head: Normocephalic and atraumatic.     Mouth/Throat:     Mouth: Mucous membranes are moist.     Pharynx: Oropharynx is clear.  Eyes:     Pupils: Pupils are equal, round, and reactive to light.  Cardiovascular:     Rate and Rhythm: Normal rate and regular rhythm.     Pulses: Normal pulses.     Heart sounds: Normal heart sounds.  Pulmonary:     Effort: Pulmonary effort is normal.     Breath sounds: Normal breath sounds.  Skin:    General: Skin is warm.  Neurological:     General: No focal deficit present.     Mental Status:  She is alert and oriented to person, place, and time.  Psychiatric:  Mood and Affect: Mood normal.        Behavior: Behavior normal.      No results found for any visits on 08/21/23.  Last CBC Lab Results  Component Value Date   WBC 8.1 05/09/2023   HGB 11.8 05/09/2023   HCT 36.6 05/09/2023   MCV 90 05/09/2023   MCH 28.9 05/09/2023   RDW 12.9 05/09/2023   PLT 395 05/09/2023   Last metabolic panel Lab Results  Component Value Date   GLUCOSE 95 01/18/2023   NA 140 01/18/2023   K 4.3 01/18/2023   CL 104 01/18/2023   CO2 24 01/18/2023   BUN 13 01/18/2023   CREATININE 0.68 01/18/2023   EGFR 116 01/18/2023   CALCIUM 9.2 01/18/2023   PROT 7.4 01/18/2023   ALBUMIN 4.3 01/18/2023   LABGLOB 3.1 01/18/2023   AGRATIO 1.4 02/02/2022   BILITOT 0.3 01/18/2023   ALKPHOS 59 01/18/2023   AST 15 01/18/2023   ALT 11 01/18/2023   ANIONGAP 9 09/13/2020   Last lipids Lab Results  Component Value Date   CHOL 240 (H) 08/08/2023   HDL 47 08/08/2023   LDLCALC 170 (H) 08/08/2023   TRIG 129 08/08/2023   CHOLHDL 5.1 (H) 08/08/2023   Last hemoglobin A1c Lab Results  Component Value Date   HGBA1C 5.4 01/18/2023   Last thyroid functions Lab Results  Component Value Date   TSH 0.624 05/09/2023   Last vitamin D Lab Results  Component Value Date   VD25OH 58.8 08/08/2023   Last vitamin B12 and Folate No results found for: "VITAMINB12", "FOLATE"    The ASCVD Risk score (Arnett DK, et al., 2019) failed to calculate for the following reasons:   The 2019 ASCVD risk score is only valid for ages 17 to 65    Assessment & Plan:      1. Elevated lipids (Primary) -Her LDL was elevated at 170 mg/dl, likely due to non compliance. Her  Pravastatin was increased from 10 mg to 20 mg and was advised to continue taking her medication daily. She was educated on medication side effects and advised to notify clinic. She was also educated on the importance of a low fat/ cholesterol diet.  She was encouraged to continue her current exercise regimen. Will recheck Lipid panel in 3 months.        Return in about 12 weeks (around 11/13/2023).    Jlynn Ly B Shahla Betsill, RN

## 2023-08-21 NOTE — Patient Instructions (Signed)
Plan de alimentacin cardiosaludable Heart-Healthy Eating Plan Muchos factores influyen en la salud cardaca, entre ellos, los hbitos de alimentacin y de ejercicio fsico. La salud del corazn tambin se denomina salud coronaria. El riesgo coronario aumenta cuando hay niveles anormales de grasa (lpidos) en la [place]. Un plan de alimentacin cardiosaludable implica limitar las grasas poco saludables, aumentar las grasas saludables, limitar el consumo de sal (sodio) y hacer otros cambios en la dieta y el estilo de [place]. En qu consiste el plan? El mdico podra recomendarle que haga lo siguiente: Que limite la ingesta de grasas al ________ % o menos del total de caloras por da. Que limite la ingesta de grasas saturadas al _________ % o menos del total de caloras por da. Que limite la cantidad de colesterol en su dieta a menos de _________ mg por da. Que limite la cantidad de sodio en su dieta a menos de _________ mg por da. Cules son algunos consejos para seguir este plan? Al cocinar Evite frer los alimentos a la hora de la coccin. Hornear, hervir, grillar y asar a la parrilla son buenas opciones. Otras formas de reducir el consumo de grasas son las siguientes: Quite la piel de las aves. Quite todas las grasas visibles de las carnes. Cocine al vapor las verduras en agua o caldo. Planificacin de las comidas  En las comidas, imagine dividir su plato en cuartos: Llene la mitad del plato con verduras y ensaladas de hojas verdes. Llene un cuarto del plato con cereales integrales. Llene un cuarto del plato con alimentos con protenas magras. Coma de 2 a 4 tazas de [place]. Una taza de verduras equivale a 1 taza (91 g) de brcoli o coliflor, 2 zanahorias medianas, 1 pimiento morrn grande, 1 batata grande, 1 tomate grande, 1 patata blanca mediana, 2 tazas (150 g) de verduras de [employer] crudas. Coma de 1 a 2 tazas de Radiation protection practitioner. Una taza de fruta equivale a 1 manzana  pequea, 1 banana grande, 1 taza (237 g) de fruta mixta, 1 naranja grande,  taza (82 g) de fruta seca, 1 taza (240 ml) de jugo de fruta al 100 %. Consuma ms alimentos con fibra soluble. Entre ellos, se incluyen las [place], el [place], las [place], los frijoles, los guisantes y Aeronautical engineer. Trate de consumir de 25 a 30 g de [employer]. Aumente el consumo de legumbres, frutos secos y semillas a 4 o 5 porciones por semana. Una porcin de frijoles secos o legumbres equivale a 1?4 taza (90 g) cocinados, 1 porcin de frutos secos equivale a  oz (12 almendras, 24 pistachos o 7 mitades de nuez) y 1 porcin de semillas equivale a  oz (8 g). Grasas Elija grasas saludables con mayor frecuencia. Elija las grasas monoinsaturadas y [address], como el aceite de oliva y canola, el aceite de [place], la semillas de [place], las nueces, las almendras y las semillas. Consuma ms grasas omega-3. Elija salmn, caballa, sardinas, atn, aceite de lino y semillas de lino molidas. Propngase comer pescado al [employer] veces por semana. Lea las etiquetas de los alimentos detenidamente para identificar los que contienen grasas trans o altas cantidades de grasas saturadas. Limite el consumo de grasas saturadas. Estas se encuentran en productos de origen animal, como carnes, mantequilla y crema. Las grasas saturadas de origen vegetal incluyen aceite de palma, de palmiste y de coco. Evite los alimentos con aceites parcialmente hidrogenados. Estos contienen grasas trans. [address] [place], se incluyen margarinas en barra, [employer]  margarinas untables, galletas dulces y saladas y otros productos horneados. Evite las comidas fritas. Informacin general Consuma ms comida casera y menos de restaurante, de bares y comida rpida. Limite o evite el alcohol. Limite los alimentos con alto contenido de International aid/development worker aadido y almidones simples, como los alimentos elaborados con harina blanca refinada (pan blanco, pasteles,  dulces). Baje de peso si es necesario. Perder solo del 5 al 10 % de su peso corporal puede ayudarlo a mejorar su estado de salud general y a Education officer, museum, como la diabetes y las enfermedades cardacas. Controle la ingesta de sodio, especialmente si tiene presin arterial alta. Hable con el mdico acerca de cambiar la ingesta de sodio. Intente incorporar ms comidas vegetarianas cada semana. Qu alimentos debo comer? Nils Pyle Nils Pyle frescas, en conserva (en su jugo natural) o frutas congeladas. Verduras Verduras frescas o congeladas (crudas, al vapor, asadas o grilladas). Ensaladas de hojas verdes. Cereales La mayora de los cereales. Elija casi siempre trigo integral y Radiation protection practitioner. Arroz y pastas, incluido el arroz integral y las pastas elaboradas con trigo integral. Armed forces operational officer y otras protenas [place] magras de res, ternera, cerdo y cordero a las que se les haya quitado la grasa visible. Pollo y pavo sin piel. Todos los pescados y [employer]. Pato salvaje, conejo, faisn y venado. Claras de huevo o sustitutos del huevo bajos en colesterol. Porotos, guisantes y lentejas secos y tofu. Semillas y la mayora de los frutos secos. Lcteos Quesos descremados y semidescremados, entre ellos, ricota y Garment/textile technologist. Leche descremada o al 1 % (lquida, en polvo o evaporada). Suero de [employer] elaborado con [employer]. Yogur descremado o semidescremado. Grasas y Writer no hidrogenadas (sin grasas trans). Aceites vegetales, incluido el de soja, ssamo, girasol, [place], [place], man, crtamo, maz, canola y semillas de algodn. Alios para ensalada o mayonesa elaborados con aceite vegetal. Halina Andreas (mineral o con gas). T y caf. T helado sin endulzar. Bebidas dietticas. Dulces y [employer], gelatina y helado de frutas. Pequeas cantidades de chocolate amargo. Limite todos los dulces y postres. Alios y [employer] y condimentos. Es posible que los  productos que se enumeran ms [place] no constituyan una lista completa de los alimentos y las bebidas que puede tomar. Consulte a un nutricionista para conocer ms opciones. Qu alimentos debo evitar? Nils Pyle Fruta enlatada en almbar espeso. Frutas con salsa de crema o mantequilla. Frutas fritas. Limite el consumo de coco. Verduras Verduras cocinadas con salsas de queso, crema o mantequilla. Verduras fritas. Cereales Panes elaborados con grasas saturadas o trans, aceites o [employer]. Croissants. Panecillos dulces. Rosquillas. Galletas con alto contenido de grasas, como las que galletas de queso y las papas fritas. Carnes y [address] protenas [address] grasas, como perros calientes, [place] de res, salchichas, tocino, asado de Producer, television/film/video o [place]. Fiambres con alto contenido de [place], como salame y Loss adjuster, chartered. Caviar. Pato y ganso domsticos. Vsceras, como hgado. Lcteos Crema, crema agria, queso crema y [place] con crema. Quesos enteros. Leche entera o al 2 % (lquida, evaporada o condensada). Suero de [employer]. Salsa de crema o queso con alto contenido de [place]. Yogur de [employer]. Grasas y [place] de carne o materia grasa. Manteca de cacao, aceites hidrogenados, aceite de palma, aceite de coco, aceite de palmiste. Grasas y [address] grasas slidas, incluida la grasa del tocino, el cerdo [place], la [place] de cerdo y Civil engineer, contracting. Sustitutos no lcteos de la crema. Aderezos para ensalada con queso o crema agria. Bebidas Refrescos regulares y cualquier bebida  con agregado de azcar. Dulces y [employer]. Pudin. Galletas dulces. Bizcochuelos. Pasteles. Chocolate con leche o chocolate blanco. Almbares con mantequilla. Helados o bebidas elaboradas con helado con alto contenido de grasas. Es posible que los productos que se enumeran ms arriba no sean una lista completa de los alimentos y las bebidas que se Theatre stage manager. Consulte a un nutricionista para obtener ms  informacin. Resumen La planificacin de comidas cardiosaludables implica limitar las grasas poco saludables, aumentar las grasas saludables, limitar el consumo de sal (sodio) y Radio producer otros cambios en la dieta y el estilo de [place]. Baje de peso si es necesario. Perder solo del 5 al 10 % de su peso corporal puede ayudarlo a mejorar su estado de salud general y a Education officer, museum, como la diabetes y las enfermedades cardacas. Propngase seguir una dieta equilibrada, que incluya frutas y verduras, productos lcteos descremados o semidescremados, protenas magras, frutos secos y legumbres, cereales integrales y aceites y grasas cardiosaludables. Esta informacin no tiene Theme park manager el consejo del mdico. Asegrese de hacerle al mdico cualquier pregunta que tenga. Document Revised: 08/31/2021 Document Reviewed: 08/31/2021 Elsevier Patient Education  2024 [employer].

## 2023-08-23 ENCOUNTER — Other Ambulatory Visit: Payer: Self-pay

## 2023-09-27 ENCOUNTER — Other Ambulatory Visit: Payer: Self-pay

## 2023-11-01 ENCOUNTER — Other Ambulatory Visit: Payer: Self-pay

## 2023-11-06 ENCOUNTER — Other Ambulatory Visit: Payer: Self-pay

## 2023-11-06 DIAGNOSIS — E785 Hyperlipidemia, unspecified: Secondary | ICD-10-CM

## 2023-11-07 ENCOUNTER — Other Ambulatory Visit: Payer: Self-pay

## 2023-11-07 LAB — LIPID PANEL
Chol/HDL Ratio: 3.8 ratio (ref 0.0–4.4)
Cholesterol, Total: 190 mg/dL (ref 100–199)
HDL: 50 mg/dL (ref >39)
LDL Chol Calc (NIH): 120 mg/dL — ABNORMAL HIGH (ref 0–99)
Triglycerides: 109 mg/dL (ref 0–149)
VLDL Cholesterol Cal: 20 mg/dL (ref 5–40)

## 2023-11-15 ENCOUNTER — Encounter: Payer: Self-pay | Admitting: Gerontology

## 2023-11-15 ENCOUNTER — Ambulatory Visit: Payer: Self-pay | Admitting: Gerontology

## 2023-11-15 ENCOUNTER — Other Ambulatory Visit: Payer: Self-pay

## 2023-11-15 VITALS — BP 102/67 | HR 62 | Wt 156.3 lb

## 2023-11-15 DIAGNOSIS — E785 Hyperlipidemia, unspecified: Secondary | ICD-10-CM

## 2023-11-15 MED ORDER — PRAVASTATIN SODIUM 20 MG PO TABS
20.0000 mg | ORAL_TABLET | Freq: Every day | ORAL | 2 refills | Status: DC
  Filled 2023-11-15: qty 30, 30d supply, fill #0

## 2023-11-15 NOTE — Patient Instructions (Signed)
Dislipidemia Dyslipidemia La dislipidemia es un desequilibrio de sustancias cerosas parecidas a la grasa (lpidos) en la sangre. El cuerpo necesita lpidos en pequeas cantidades. Con frecuencia, la dislipidemia implica un nivel alto de colesterol o triglicridos, que son tipos de lpidos. Las formas frecuentes de dislipidemia incluyen las siguientes: Niveles elevados de colesterol LDL. El LDL es el tipo de colesterol que causa la acumulacin de depsitos de grasa (placas) en los vasos sanguneos que transportan la sangre fuera del corazn (arterias). Niveles bajos de colesterol HDL. El HDL es el tipo de colesterol que brinda proteccin contra las enfermedades cardacas. Los niveles altos de HDL eliminan la acumulacin de LDL de las arterias. Niveles altos de triglicridos. Los triglicridos son una sustancia grasa presente en la sangre que se relaciona con la acumulacin de placa en las arterias. Cules son las causas? Hay dos tipos principales de dislipidemia: primaria y secundaria. La dislipidemia primaria es causada por cambios (mutaciones) en los genes que se transmiten a travs de las familias (se heredan). Estas mutaciones causan varios tipos de dislipidemia. La dislipidemia secundaria puede ser causada por diversos factores de riesgo que pueden provocar la enfermedad, como las opciones de estilo de vida y ciertas enfermedades. Qu incrementa el riesgo? Tiene ms probabilidades de desarrollar esta afeccin si es un hombre mayor o si es una mujer que ha pasado por la menopausia. Otros factores de riesgo son los siguientes: Tener antecedentes familiares de dislipidemia. Tomar determinados medicamentos, entre ellos, pldoras anticonceptivas, corticoesteroides, algunos diurticos y betabloqueantes. Seguir una dieta con alto contenido de grasas saturadas. Fumar cigarrillos o beber alcohol en exceso. Tener ciertas afecciones mdicas, como diabetes, sndrome de ovario poliqustico (SOP), enfermedad  renal, enfermedad heptica o hipotiroidismo. No hacer ejercicio regularmente. Tener sobrepeso o ser obeso con demasiada grasa en el abdomen. Cules son los signos o sntomas? En la mayora de los casos, la dislipidemia no causa ningn sntoma. En los casos graves, los niveles muy altos de lpidos pueden causar: Protuberancias de grasa debajo de la piel (xantomas). Un anillo blanco o gris alrededor del centro negro (pupila) del ojo. Los niveles muy altos de triglicridos pueden causar inflamacin del pncreas (pancreatitis). Cmo se diagnostica? Su mdico puede diagnosticar dislipidemia basndose en un anlisis de sangre de rutina (anlisis de sangre en ayunas). Como la mayora de las personas no tienen sntomas de la afeccin, este anlisis de sangre (perfil de lpidos) se realiza en adultos mayores de 20 aos y se repite cada 4 a 6 aos. En este anlisis, se controla lo siguiente: Colesterol total. Esto mide la cantidad total de colesterol en la sangre, que incluye el colesterol LDL, el colesterol HDL y los triglicridos. Un valor saludable est por debajo de 200 mg/dl (5.17 mmol/l). Colesterol LDL. El valor objetivo de colesterol LDL es diferente para cada persona, en funcin de los factores de riesgo individuales. Un valor saludable suele estar por debajo de 100 mg/dl (2.59 mmol/l). Consulte al mdico cul debe ser el valor del colesterol LDL para usted. Colesterol HDL. Un nivel de colesterol HDL de 60 mg/dl (1.55 mmol/l) o superior es lo mejor porque ayuda a proteger contra las enfermedades cardacas. Un valor por debajo de 40 mg/dl (1.03 mmol/l) para los hombres o por debajo de 50 mg/dl (1.29 mmol/l) para las mujeres aumenta el riesgo de enfermedad cardaca. Triglicridos. Un valor de triglicridos saludable est por debajo de 150 mg/dl (1.69 mmol/l). Si su perfil de lpidos es anormal, su mdico puede realizar otros anlisis de sangre. Cmo se trata? El tratamiento   depende del tipo de  dislipidemia que usted tenga y sus otros factores de riesgo de enfermedades cardacas o accidente cerebrovascular. Su mdico tendr un rango objetivo para sus niveles de lpidos en funcin de esta informacin. El tratamiento para la dislipidemia comienza con cambios en el estilo de vida, tales como dieta y ejercicio. El mdico podra recomendarle que haga lo siguiente: Hacer ejercicio con regularidad. Realizar cambios en la dieta. Si fuma, dejar de hacerlo. Limitar el consumo de bebidas alcohlicas. Si los cambios en la dieta y la actividad fsica no ayudan a alcanzar sus objetivos, el mdico tambin puede recetarle medicamentos para disminuir los lpidos. El tipo de medicamento recetado con ms frecuencia disminuye el colesterol LDL (estatinas). Si tiene un nivel alto de triglicridos, su mdico puede recetarle otro tipo de frmaco (fibratos) o un suplemento de aceite de pescado con omega-3, o ambos. Siga estas instrucciones en su casa: Comida y bebida  Siga las indicaciones del mdico o el nutricionista respecto de las restricciones para las comidas o las bebidas. Siga una dieta saludable como se lo haya indicado el mdico. Esto puede ayudarle a alcanzar y mantener un peso saludable, reducir el colesterol LDL y aumentar el colesterol HDL. Puede incluir: Limitar sus caloras, si tiene sobrepeso. Comer ms frutas, verduras, cereales integrales, pescado y carnes magras. Limitar las grasas saturadas, las grasas trans y el colesterol. No beba alcohol si: Su mdico le indica no hacerlo. Est embarazada, puede estar embarazada o est tratando de quedar embarazada. Si bebe alcohol: Limite la cantidad que bebe a lo siguiente: De 0 a 1 medida por da para las mujeres. De 0 a 2 medidas por da para los hombres. Sepa cunta cantidad de alcohol hay en las bebidas que toma. En los [place], una medida equivale a una botella de cerveza de 12 oz (355 ml), un vaso de vino de 5 oz (148 ml) o un vaso de  una bebida alcohlica de alta graduacin de 1 oz (44 ml). Actividad Haga ejercicio con regularidad. Siga un programa de ejercicio y entrenamiento de fuerza tal como se lo haya indicado el mdico. Pregntele al mdico qu actividades son seguras para usted. El mdico puede recomendarle lo siguiente: 30 minutos de actividad aerbica de 4 a 6 das por semana. La caminata a paso ligero es un ejemplo de actividad aerbica. Entrenamiento de fuerza 2 das por semana. Indicaciones generales No consuma ningn producto que contenga nicotina o tabaco. Estos productos incluyen cigarrillos, tabaco para mascar y aparatos de vapeo, como los cigarrillos electrnicos. Si necesita ayuda para dejar de consumir estos productos, consulte al mdico. Use los medicamentos de venta libre y los recetados solamente como se lo haya indicado el mdico. Esto incluye los suplementos. Concurra a todas las visitas de seguimiento. Esto es importante. Comunquese con un mdico si: Tiene dificultad para cumplir con su plan de actividad fsica o su dieta. Le cuesta dejar de fumar o controlar el consumo de alcohol. Resumen Con frecuencia, la dislipidemia implica un nivel alto de colesterol o triglicridos, que son tipos de lpidos. El tratamiento depende del tipo de dislipidemia que usted tenga y sus otros factores de riesgo de enfermedades cardacas o accidente cerebrovascular. El tratamiento para la dislipidemia comienza con cambios en el estilo de vida, tales como dieta y ejercicio. Su mdico puede recetarle medicamentos para disminuir los lpidos. Esta informacin no tiene como fin reemplazar el consejo del mdico. Asegrese de hacerle al mdico cualquier pregunta que tenga. Document Revised: 09/24/2020 Document Reviewed: 09/24/2020 Elsevier Patient   Education  2024 [employer] Inc.  

## 2023-11-15 NOTE — Progress Notes (Signed)
 Established Patient Office Visit  Subjective   Patient ID: Jordan Mccarthy, female    DOB: 11/08/1985  Age: 38 y.o. MRN: [id number]  Chief Complaint  Patient presents with   Follow-up    HPI Jordan Mccarthy is a 38 year old female with a history of elevated LDL who presents for routine follow up visit and lab review.  Her lipid panel was obtained on 11/06/2023, total cholesterol decreased from 240 mg/dL to 409 mg/dL,  and LDL decreased  from 170 mg/dl to 811 mg/dL. She reports strict adherence to her medication and denies medication side effects. She reports she eats foods foods such as salmon, avacado and reduces fried foods.  She denies physical activity but is open to starting.  Overall, she states she is doing well and denies any additional complaints.      Patient Active Problem List   Diagnosis Date Noted   Vitamin D  deficiency 08/21/2023   Sad mood 05/09/2023   Right knee pain 01/31/2023   Itchy eyes 10/18/2022   Elevated lipids 03/02/2022   Dental caries 02/23/2020   Overweight 08/13/2019   Past Medical History:  Diagnosis Date   Anemia    Encounter to establish care 02/02/2022   Hemorrhoid    dx 7 yrs ago per pt.   Hyperlipidemia    Past Surgical History:  Procedure Laterality Date   NO PAST SURGERIES     Social History   Tobacco Use   Smoking status: Never   Smokeless tobacco: Never  Vaping Use   Vaping status: Never Used  Substance Use Topics   Alcohol use: Not Currently    Alcohol/week: 2.0 standard drinks of alcohol    Types: 2 Cans of beer per week    Comment: last use 10/2019   Drug use: Never   Social History   Socioeconomic History   Marital status: Single    Spouse name: Ranulfo Guiterrez   Number of children: 3   Years of education: Not on file   Highest education level: 9th grade  Occupational History   Occupation: Unemployed  Tobacco Use   Smoking status: Never   Smokeless tobacco: Never  Vaping Use   Vaping status: Never  Used  Substance and Sexual Activity   Alcohol use: Not Currently    Alcohol/week: 2.0 standard drinks of alcohol    Types: 2 Cans of beer per week    Comment: last use 10/2019   Drug use: Never   Sexual activity: Yes    Partners: Male    Birth control/protection: Pill, Injection    Comment: last sex approx 1.5 weeks ago  Other Topics Concern   Not on file  Social History Narrative   Not on file   Social Drivers of Health   Financial Resource Strain: Low Risk  (01/31/2023)   Overall Financial Resource Strain (CARDIA)    Difficulty of Paying Living Expenses: Not hard at all  Food Insecurity: No Food Insecurity (01/31/2023)   Hunger Vital Sign    Worried About Running Out of Food in the Last Year: Never true    Ran Out of Food in the Last Year: Never true  Transportation Needs: No Transportation Needs (01/31/2023)   PRAPARE - Administrator, Civil Service (Medical): No    Lack of Transportation (Non-Medical): No  Physical Activity: Inactive (01/31/2023)   Exercise Vital Sign    Days of Exercise per Week: 0 days    Minutes of Exercise per Session: 0 min  Stress: No Stress Concern Present (01/31/2023)   [employer] of Occupational Health - Occupational Stress Questionnaire    Feeling of Stress : Not at all  Social Connections: Moderately Isolated (01/31/2023)   Social Connection and Isolation Panel [NHANES]    Frequency of Communication with Friends and Family: More than three times a week    Frequency of Social Gatherings with Friends and Family: More than three times a week    Attends Religious Services: More than 4 times per year    Active Member of [employer] or Organizations: No    Attends Banker Meetings: Never    Marital Status: Never married  Intimate Partner Violence: Not At Risk (01/31/2023)   Humiliation, Afraid, Rape, and Kick questionnaire    Fear of Current or Ex-Partner: No    Emotionally Abused: No    Physically Abused: No    Sexually  Abused: No   Family Status  Relation Name Status   Mother  Deceased   Father  Alive   Sister  Alive   Brother  Alive   MGM  Deceased   MGF  Deceased   PGM  Deceased   PGF  Deceased   Son  (Not Specified)  No partnership data on file   Family History  Problem Relation Age of Onset   Early death Mother        while giving child birth   Lung disease Father    Asthma Sister    Thrombosis Sister    Other Maternal Grandmother        unknown medical history   Other Maternal Grandfather        unknown medical history   Stomach cancer Paternal Grandmother    Other Paternal Grandfather        unknown medical history   Asthma Son    No Known Allergies    Review of Systems  Constitutional: Negative.   Respiratory: Negative.    Cardiovascular: Negative.   Gastrointestinal: Negative.   Genitourinary: Negative.   Skin: Negative.   Neurological: Negative.   Psychiatric/Behavioral: Negative.        Objective:     BP 102/67   Pulse 62   Wt 156 lb 4.8 oz (70.9 kg)   LMP 11/14/2023   SpO2 99%   BMI 28.59 kg/m  BP Readings from Last 3 Encounters:  11/15/23 102/67  08/21/23 108/69  08/17/23 (!) 112/57   Wt Readings from Last 3 Encounters:  11/15/23 156 lb 4.8 oz (70.9 kg)  08/21/23 155 lb 9.6 oz (70.6 kg)  08/17/23 156 lb 8 oz (71 kg)      Physical Exam HENT:     Head: Normocephalic and atraumatic.  Eyes:     Extraocular Movements: Extraocular movements intact.     Conjunctiva/sclera: Conjunctivae normal.     Pupils: Pupils are equal, round, and reactive to light.  Cardiovascular:     Rate and Rhythm: Normal rate and regular rhythm.     Pulses: Normal pulses.     Heart sounds: Normal heart sounds.  Pulmonary:     Effort: Pulmonary effort is normal.     Breath sounds: Normal breath sounds.  Skin:    General: Skin is warm and dry.  Neurological:     General: No focal deficit present.     Mental Status: She is alert and oriented to person, place, and time.   Psychiatric:        Mood and Affect: Mood normal.  Behavior: Behavior normal.        Thought Content: Thought content normal.        Judgment: Judgment normal.      No results found for any visits on 11/15/23.  Last CBC Lab Results  Component Value Date   WBC 8.1 05/09/2023   HGB 11.8 05/09/2023   HCT 36.6 05/09/2023   MCV 90 05/09/2023   MCH 28.9 05/09/2023   RDW 12.9 05/09/2023   PLT 395 05/09/2023   Last metabolic panel Lab Results  Component Value Date   GLUCOSE 95 01/18/2023   NA 140 01/18/2023   K 4.3 01/18/2023   CL 104 01/18/2023   CO2 24 01/18/2023   BUN 13 01/18/2023   CREATININE 0.68 01/18/2023   EGFR 116 01/18/2023   CALCIUM 9.2 01/18/2023   PROT 7.4 01/18/2023   ALBUMIN 4.3 01/18/2023   LABGLOB 3.1 01/18/2023   AGRATIO 1.4 02/02/2022   BILITOT 0.3 01/18/2023   ALKPHOS 59 01/18/2023   AST 15 01/18/2023   ALT 11 01/18/2023   ANIONGAP 9 09/13/2020   Last lipids Lab Results  Component Value Date   CHOL 190 11/06/2023   HDL 50 11/06/2023   LDLCALC 120 (H) 11/06/2023   TRIG 109 11/06/2023   CHOLHDL 3.8 11/06/2023   Last hemoglobin A1c Lab Results  Component Value Date   HGBA1C 5.4 01/18/2023   Last thyroid functions Lab Results  Component Value Date   TSH 0.624 05/09/2023   Last vitamin D  Lab Results  Component Value Date   VD25OH 58.8 08/08/2023   Last vitamin B12 and Folate No results found for: "VITAMINB12", "FOLATE"    The ASCVD Risk score (Arnett DK, et al., 2019) failed to calculate for the following reasons:   The 2019 ASCVD risk score is only valid for ages 27 to 42    Assessment & Plan:  1. Elevated lipids (Primary) She was encouraged to continue to take the Prevastatin 20 mg as prescribed, educated on medication side effects and advised to notify clinic. She was also educated on the importance of a low fat/ cholesterol diet. Will recheck lipid panel in 3 months. - pravastatin  (PRAVACHOL ) 20 MG tablet; Take 1  tablet (20 mg total) by mouth daily.  Dispense: 30 tablet; Refill: 1 - Lipid panel; Future  Return in about 3 months (around 02/14/2024) or if symptoms worsen or fail to improve.    Return in about 3 months (around 02/14/2024).    Becci Batty, RN

## 2023-12-02 NOTE — Progress Notes (Unsigned)
 [employer] HEALTH DEPARTMENT [facility] [address] [place] [address] Main phone: [phone]  Family Planning Visit - Repeat Yearly Visit  Subjective:  Jordan Mccarthy is a 38 y.o. G3P3003  being seen today for an annual wellness visit and to discuss contraception options. The patient is currently using oral contraceptive for pregnancy prevention. Patient does not want a pregnancy in the next year.   Patient reports they are looking for a method with the following characteristics:  Cycle control High efficacy at preventing pregnancy Minimal bleeding or improved bleeding profile Reduced androgen effects (i.e. decreased hair growth) Not associated with weight gain Does not involve insertion  Ready when they are Method they can control starting and stopping  Patient has the following medical problems:  Patient Active Problem List   Diagnosis Date Noted   Vitamin D  deficiency 08/21/2023   Sad mood 05/09/2023   Right knee pain 01/31/2023   Itchy eyes 10/18/2022   Elevated lipids 03/02/2022   Dental caries 02/23/2020   Overweight 08/13/2019   No chief complaint on file.   HPI Patient is a pleasant 38 y.o. female who presents to the office today for annual well woman exam and is requesting symptomatic/asymptomatic STI testing.  Patient reports concerns today include the following:  Patient indicates ----- female/female partners in the last 2 months. She reports practicing vaginal, oral, anal sex and uses condoms sometimes. Patient indicates no STI history. Patient reports last sex was ---------. She indicates use of/ no use of -------------- as contraception method.  Patient indicates LMP was ------------.    Review of Systems  Constitutional:  Negative for weight loss.  HENT:  Negative for sore throat.   Eyes:  Negative for blurred vision.  Respiratory:  Negative for cough, shortness of breath and wheezing.   Cardiovascular:   Negative for chest pain and claudication.  Gastrointestinal:  Negative for nausea and vomiting.  Genitourinary:  Negative for dysuria and frequency.  Skin:  Negative for rash.  Neurological:  Negative for dizziness, seizures and headaches.  Endo/Heme/Allergies:  Does not bruise/bleed easily.   See flowsheet for further details and programmatic requirements Hyperlink available at the top of the signed note in blue.  Flow sheet content below:     Diabetes screening This patient is 38 y.o. with a BMI of There is no height or weight on file to calculate BMI..  Is patient eligible for diabetes screening (age >35 and BMI >25)?  no - Patient has a PCP she sees regularly for labs.  Was Hgb A1c ordered? not applicable  STI screening Patient reports {NUMBER 1-10:22536} of partners in last year.  Does this patient desire STI screening?  {Yes or If no, why not?:20788}  Hepatitis C screening Has patient been screened once for HCV in the past?  Yes  02/23/2020: Negative  Does the patient meet criteria for HCV testing? {yes/no:20286}  ***(If yes-- Screen for HCV through [facility]) Criteria:  Since the last HCV result, does the patient have any of the following? - Current drug use - Have a partner with drug use - Has been incarcerated  Hepatitis B screening Does the patient meet criteria for HBV testing? {yes/no:20286} Criteria:  -Household, sexual or needle sharing contact with HBV -History of drug use -HIV positive -Those with known Hep C  Cervical Cancer Screening  Result Date Procedure Results Follow-ups  02/23/2020 IGP, Aptima HPV DIAGNOSIS:: Comment Specimen adequacy:: Comment Clinician Provided ICD10: Comment Performed by:: Comment  PAP Smear Comment: . Note:: Comment Test Methodology: Comment HPV Aptima: Negative   02/23/2020 Pap Test with HP (IPS) Pap Smear: NILM HPV: HRHPV -   06/08/2017 HM PAP SMEAR HM Pap smear: Negative   06/08/2017 Pap IG (Image Guided)     06/08/2017 Pap IG (Image Guided)      Health Maintenance Due  Topic Date Due   COVID-19 Vaccine (1) Never done    The following portions of the patient's history were reviewed and updated as appropriate: allergies, current medications, past family history, past medical history, past social history, past surgical history and problem list. Problem list updated.  Objective:  There were no vitals filed for this visit.  Physical Exam Vitals and nursing note reviewed.  Constitutional:      Appearance: Normal appearance.  HENT:     Head: Normocephalic.     Salivary Glands: Right salivary gland is not diffusely enlarged or tender. Left salivary gland is not diffusely enlarged or tender.     Mouth/Throat:     Lips: Pink. No lesions.     Mouth: Mucous membranes are moist.     Tongue: No lesions. Tongue does not deviate from midline.     Pharynx: Oropharynx is clear. Uvula midline. No oropharyngeal exudate or posterior oropharyngeal erythema.     Tonsils: No tonsillar exudate.  Eyes:     General:        Right eye: No discharge.        Left eye: No discharge.  Neck:     Thyroid: No thyroid mass or thyroid tenderness.     Trachea: Trachea and phonation normal. No tracheal tenderness or tracheal deviation.  Cardiovascular:     Rate and Rhythm: Normal rate and regular rhythm.     Heart sounds: Normal heart sounds, S1 normal and S2 normal.  Pulmonary:     Effort: Pulmonary effort is normal.     Breath sounds: Normal breath sounds and air entry.  Abdominal:     General: Abdomen is flat. Bowel sounds are normal.     Palpations: Abdomen is soft.     Tenderness: There is no abdominal tenderness. There is no guarding or rebound.  Genitourinary:    General: Normal vulva.     Exam position: Lithotomy position.     Pubic Area: No rash or pubic lice.      Tanner stage (genital): 5.     Labia:        Right: No rash, tenderness, lesion or injury.        Left: No rash, tenderness, lesion or  injury.      Vagina: Normal. No signs of injury and foreign body. No vaginal discharge, erythema, tenderness, bleeding or lesions.     Cervix: Normal. No cervical motion tenderness, discharge, friability, lesion, erythema, cervical bleeding or eversion.     Uterus: Normal.      Adnexa: Right adnexa normal and left adnexa normal.     Comments: pH<4.5 Abnormal Discharge Present: Yes/No Location: Vaginal vault or from cervical os Color: Consistency: Odor: Adherent:  Lymphadenopathy:     Head:     Right side of head: No submental, submandibular, tonsillar, preauricular or posterior auricular adenopathy.     Left side of head: No submental, submandibular, tonsillar, preauricular or posterior auricular adenopathy.     Cervical: No cervical adenopathy.     Right cervical: No superficial or posterior cervical adenopathy.    Left cervical: No superficial or posterior cervical adenopathy.  Upper Body:     Right upper body: No supraclavicular or axillary adenopathy.     Left upper body: No supraclavicular or axillary adenopathy.     Lower Body: No right inguinal adenopathy. No left inguinal adenopathy.  Skin:    General: Skin is warm and dry.     Findings: No lesion or rash.     Comments: Skin tone appropriate for ethnicity.   Neurological:     Mental Status: She is alert and oriented to person, place, and time.  Psychiatric:        Attention and Perception: Attention and perception normal.        Mood and Affect: Mood and affect normal.        Speech: Speech normal.        Behavior: Behavior normal. Behavior is cooperative.        Thought Content: Thought content normal.   Assessment and Plan:  Ardine Eftihia Doubet is a 38 y.o. female G3P3003 presenting to the [facility] Department for an yearly wellness and contraception visit  Contraception counseling:  Reviewed options based on patient desire and reproductive life plan. Patient is interested in {Upstream End  Methods:[address]}. This {WAS/WAS NOT:[id number]::"was not"} provided to the patient today. *** if not why not clearly documented  Risks, benefits, and typical effectiveness rates were reviewed.  Questions were answered.  Written information was also given to the patient to review.    The patient will follow up in  1 years for surveillance.  The patient was told to call with any further questions, or with any concerns about this method of contraception.  Emphasized use of condoms 100% of the time for STI prevention.  Emergency Contraception Precautions (ECP): Patient assessed for need of ECP. She {ACTION; IS/IS ZOX:09604540} a candidate based on {sex options:32690}.  Educated on ECP and reviewed options.  Patient desires {ecp options:32691}.   1. Family planning (Primary)   2. Well woman exam CBE performed today. Repeat in 1 year. Mammogram to begin at age 76 unless concerns present sooner.  PAP not indicated today based on ASCCP guidelines. Due in 1 year (02/2025). Patient has PCP. Encouraged to continue following with them.   3. Screening for venereal disease ***   No follow-ups on file.  Future Appointments  Date Time Provider Department Center  12/03/2023  9:25 AM [facility] PROVIDER [facility] None  02/07/2024  9:30 AM Iloabachie, Chioma E, NP [facility] None   Due to language barrier, a Spanish interpreter ({achd interps:32101}) was present {achd interp options:32100::"in person"} during the history-taking, subsequent discussion, and physical exam with this patient.    Merleen Stare, NP

## 2023-12-03 ENCOUNTER — Ambulatory Visit: Payer: Self-pay

## 2023-12-03 DIAGNOSIS — Z3009 Encounter for other general counseling and advice on contraception: Secondary | ICD-10-CM

## 2023-12-03 DIAGNOSIS — Z01419 Encounter for gynecological examination (general) (routine) without abnormal findings: Secondary | ICD-10-CM

## 2023-12-03 DIAGNOSIS — Z113 Encounter for screening for infections with a predominantly sexual mode of transmission: Secondary | ICD-10-CM

## 2023-12-07 ENCOUNTER — Ambulatory Visit: Payer: Self-pay

## 2023-12-13 ENCOUNTER — Ambulatory Visit: Payer: Self-pay | Admitting: Nurse Practitioner

## 2023-12-13 ENCOUNTER — Encounter: Payer: Self-pay | Admitting: Nurse Practitioner

## 2023-12-13 VITALS — BP 102/64 | HR 56 | Ht 67.0 in | Wt 156.0 lb

## 2023-12-13 DIAGNOSIS — Z3041 Encounter for surveillance of contraceptive pills: Secondary | ICD-10-CM

## 2023-12-13 DIAGNOSIS — Z30011 Encounter for initial prescription of contraceptive pills: Secondary | ICD-10-CM

## 2023-12-13 DIAGNOSIS — Z3009 Encounter for other general counseling and advice on contraception: Secondary | ICD-10-CM

## 2023-12-13 DIAGNOSIS — Z0000 Encounter for general adult medical examination without abnormal findings: Secondary | ICD-10-CM

## 2023-12-13 MED ORDER — NORGESTIM-ETH ESTRAD TRIPHASIC 0.18/0.215/0.25 MG-25 MCG PO TABS: 1.0000 | ORAL_TABLET | Freq: Every day | ORAL | 12 refills | Status: DC

## 2023-12-13 NOTE — Progress Notes (Addendum)
 Pt is here for family planning visit.  Family planning education card reviewed and given to pt.  Condoms declined. The patient was dispensed tri lo milli #12 packs today. I provided counseling today regarding the medication. We discussed the medication, the side effects and when to call clinic. Patient given the opportunity to ask questions. Questions answered.   Caren Channel, RN

## 2023-12-25 NOTE — Progress Notes (Addendum)
 [employer] HEALTH DEPARTMENT [facility] [address] [place] [address] Main phone: [phone]  Family Planning Visit - Repeat Yearly Visit  Subjective:  Jordan Mccarthy is a 38 y.o. G3P3003  being seen today for an annual wellness visit and to discuss contraception options. The patient is currently using oral contraceptive for pregnancy prevention. Patient does not want a pregnancy in the next year.   Patient reports they are looking for a method with the following characteristics:  Cycle control High efficacy at preventing pregnancy Minimal bleeding or improved bleeding profile Reduced androgen effects (i.e. decreased hair growth) Does not involve insertion  Method they can control starting and stopping  Patient has the following medical problems:  Patient Active Problem List   Diagnosis Date Noted   Vitamin D  deficiency 08/21/2023   Sad mood 05/09/2023   Right knee pain 01/31/2023   Itchy eyes 10/18/2022   Elevated lipids 03/02/2022   Dental caries 02/23/2020   Overweight 08/13/2019   Chief Complaint  Patient presents with   Annual Exam    And ocp     HPI Patient is a pleasant 38 y.o. female who presents to the office today for annual well woman exam and refill of OCP. Patient reports NO concerns today. Patient indicates 1 female partner in the last 2 and 12 months. She reports practicing vaginal sex and does not use condoms sometimes. Patient indicates no STI history. Patient reports last sex was 2 weeks ago without a condom. She indicates use of oral contraceptive pills as contraception method. Reports that she has 3 OCPs left and has not missed any pills.  Patient indicates LMP was 11/13/23 and she has periods monthly.    Review of Systems  Constitutional:  Negative for weight loss.  HENT:  Negative for sore throat.   Eyes:  Negative for blurred vision.  Respiratory:  Negative for cough, shortness of breath and  wheezing.   Cardiovascular:  Negative for chest pain and claudication.  Gastrointestinal:  Negative for nausea and vomiting.  Genitourinary:  Negative for dysuria and frequency.  Skin:  Negative for rash.  Neurological:  Negative for dizziness, seizures and headaches.  Endo/Heme/Allergies:  Does not bruise/bleed easily.    See flowsheet for further details and programmatic requirements Hyperlink available at the top of the signed note in blue.  Flow sheet content below:  Pregnancy Intention Screening Does the patient want to become pregnant in the next year?: No Does the patient's partner want to become pregnant in the next year?: No Would the patient like to discuss contraceptive options today?: No Contraception History Past methods of contraception used by patient:: Contraceptive Pill Adverse effects associated with Contraceptive Pill: none Sexual History What age did you start your period?: 15 How often do you have your period?: monthly Date of last sex?: 11/29/23 Has the patient had unprotected sex within the last 5 days?: No Do you have sex with men, women, both men and women?: Men only In the past 2 months how many partners have you had sex with?: 1 In the past 12 months, how many partners have you had sex with?: 1 Is it possible that any of your sex partners in the past 12 months had sex with someone else whild they were still in a sexual relationship with you?: No What ways do you have sex?: Vaginal Do you or your partner use condoms and/or dental dams every time you have vaginal, oral or anal sex?: No Do  you douche?: No Date of last HIV test?: 06/21/20 Have you ever had an STD?: No Have any of your partners had an STD?: No Have you or your partner ever shot up drugs?: No Have any of your partners used drugs in the past?: No Have you or your partners exchanged money or drugs for sex?: No Risk Factors for Hep B Household, sexual, or needle sharing contact of a person  infected with Hep B: No Sexual contact with a person who uses drugs not as prescribed?: No Currently or Ever used drugs not as prescribed: No HIV Positive: No PRep Patient: No Men who have sex with men: N/A Have Hepatitis C: No History of Incarceration: No History of Homeslessness?: No Anal sex following anal drug use?: No Risk Factors for Hep C Currently using drugs not as prescribed: No Sexual partner(s) currently using drugs as not prescribed: No History of drug use: No HIV Positive: No People with a history of incarceration: No People born between the years of 69 and 97: No Counseling All Patients: Stop tobacco use, implementing the 5A counseling approach (R), Use specific methods of contraceoptive and identify adverse effects (R), Encourage mammagram for women 54 or older and younger than 50 if conditions support (R), Emergency Contraception Offered (R) if unprotected sex in past 5 days and/or propyhlactically as indicated., Provide emergency contraception counseling (R), Typical use rates for method effectiveness (R), Delay future pregnancy from 18 months to 5 years (R) at [facility] visit, Appropriate referral for additional services as needed (R) Education: Make informed decision about family planning, Reduce risk of transmission and protection from STD's and HIV, Understand BMI >25 or >18.5 is a health risk (weight management educational materials to be provided to client requests), Promoted daily consumption of MVI with folic acid if capable of conceiving., Results of physical assessment and labs (if performed), How to discontinue the method selected and information on back up method used, How to use the method selected and information on back up method used, How to use the method consistently and correctly, Teach back method completed, Warning signs for rare but serious adverse events and what to do if they experience a warning sign (including emergency 24 hour number, where to seek  emergency service outside of hours of operation), When to return for follow up (planned return schedule), Is patient pregnant?, PCP list given to patient Contraception Wrap Up Current Method: Oral Contraceptive End Method: Oral Contraceptive Contraception Counseling Provided: Yes How was the end contraceptive method provided?: Provided on site Interpreter Interpreters used: Edgar Goods Te[place]eria  Diabetes screening This patient is 38 y.o. with a BMI of Body mass index is 24.43 kg/m.Aaron Aas  Is patient eligible for diabetes screening (age >35 and BMI >25)?  no  Was Hgb A1c ordered? not applicable  STI screening Patient reports 1 of partners in last year.  Does this patient desire STI screening?  No - personal choice.  Hepatitis C screening Has patient been screened once for HCV in the past?  No  No results found for: "HCVAB"  Does the patient meet criteria for HCV testing? No  (If yes-- Screen for HCV through [facility]) Criteria:  Since the last HCV result, does the patient have any of the following? - Current drug use - Have a partner with drug use - Has been incarcerated  Hepatitis B screening Does the patient meet criteria for HBV testing? No Criteria:  -Household, sexual or needle sharing contact with HBV -History of drug use -HIV positive -  Those with known Hep C  Cervical Cancer Screening  Result Date Procedure Results Follow-ups  02/23/2020 IGP, Aptima HPV DIAGNOSIS:: Comment Specimen adequacy:: Comment Clinician Provided ICD10: Comment Performed by:: Comment PAP Smear Comment: . Note:: Comment Test Methodology: Comment HPV Aptima: Negative   02/23/2020 Pap Test with HP (IPS) Pap Smear: NILM HPV: HRHPV -   06/08/2017 HM PAP SMEAR HM Pap smear: Negative   06/08/2017 Pap IG (Image Guided)    06/08/2017 Pap IG (Image Guided)      Health Maintenance Due  Topic Date Due   COVID-19 Vaccine (1) Never done    The following portions of the patient's history were reviewed  and updated as appropriate: allergies, current medications, past family history, past medical history, past social history, past surgical history and problem list. Problem list updated.  Objective:   Vitals:   12/13/23 0954  BP: 102/64  Pulse: (!) 56  Weight: 156 lb (70.8 kg)  Height: 5\' 7"  (1.702 m)    Physical Exam Vitals and nursing note reviewed. Exam conducted with a chaperone present Nani Baba, [place] interpreter present as chaperone.).  Constitutional:      Appearance: Normal appearance.  HENT:     Head: Normocephalic.     Salivary Glands: Right salivary gland is not diffusely enlarged or tender. Left salivary gland is not diffusely enlarged or tender.     Mouth/Throat:     Lips: Pink. No lesions.     Mouth: Mucous membranes are moist.     Tongue: No lesions. Tongue does not deviate from midline.     Pharynx: Oropharynx is clear. Uvula midline.     Tonsils: No tonsillar exudate.  Eyes:     General:        Right eye: No discharge.        Left eye: No discharge.     Conjunctiva/sclera:     Right eye: Right conjunctiva is not injected.     Left eye: Left conjunctiva is not injected.  Neck:     Thyroid: No thyroid mass, thyromegaly or thyroid tenderness.     Trachea: Trachea and phonation normal. No tracheal tenderness or tracheal deviation.  Cardiovascular:     Rate and Rhythm: Normal rate and regular rhythm.     Heart sounds: Normal heart sounds, S1 normal and S2 normal.  Pulmonary:     Effort: Pulmonary effort is normal.     Breath sounds: Normal breath sounds and air entry.  Chest:  Breasts:    Tanner Score is 5.     Breasts are symmetrical.     Right: Normal.     Left: Normal.  Abdominal:     General: Abdomen is flat. Bowel sounds are normal. There is no distension.     Palpations: Abdomen is soft.     Tenderness: There is no abdominal tenderness. There is no guarding or rebound.  Genitourinary:    Comments: PAP not due. Declined STI testing.  Declined genital exam.  Lymphadenopathy:     Head:     Right side of head: No submental, submandibular, tonsillar, preauricular or posterior auricular adenopathy.     Left side of head: No submental, submandibular, tonsillar, preauricular or posterior auricular adenopathy.     Cervical: No cervical adenopathy.     Right cervical: No superficial or posterior cervical adenopathy.    Left cervical: No superficial or posterior cervical adenopathy.     Upper Body:     Right upper body: No supraclavicular or axillary adenopathy.  Left upper body: No supraclavicular or axillary adenopathy.  Skin:    General: Skin is warm and dry.     Findings: No lesion or rash.     Comments: Skin tone appropriate for ethnicity.   Neurological:     Mental Status: She is alert and oriented to person, place, and time.  Psychiatric:        Attention and Perception: Attention and perception normal.        Mood and Affect: Mood and affect normal.        Speech: Speech normal.        Behavior: Behavior normal. Behavior is cooperative.        Thought Content: Thought content normal.    Assessment and Plan:  Neidra Timera Windt is a 38 y.o. female G3P3003 presenting to the [facility] Department for an yearly wellness and contraception visit.  1. Family planning (Primary) Contraception counseling:  Reviewed options based on patient desire and reproductive life plan. Patient is interested in Oral Contraceptive. This was provided to the patient today.   Risks, benefits, and typical effectiveness rates were reviewed.  Questions were answered.  Written information was also given to the patient to review.    The patient will follow up in  1 years for surveillance.  The patient was told to call with any further questions, or with any concerns about this method of contraception.  Emphasized use of condoms 100% of the time for STI prevention.  Emergency Contraception Precautions (ECP): Patient assessed for  need of ECP. She is not a candidate based on OCPs used correctly without missed doses.   - Norgestim-Eth Estrad Triphasic (NORGESTIMATE -ETHINYL ESTRADIOL  TRIPHASIC) 0.18/0.215/0.25 MG-25 MCG tab; Take 1 tablet by mouth daily.  Dispense: 28 tablet; Refill: 12  2. Well woman exam (no gynecological exam) PAP not due today. Due 02/2025. CBE completed. Repeat annually per ACOG guidelines. Mammogram to begin at age 43 unless concerns present sooner.  Patient has PCP.   3. Encounter for surveillance of contraceptive pills  - Norgestim-Eth Estrad Triphasic (NORGESTIMATE -ETHINYL ESTRADIOL  TRIPHASIC) 0.18/0.215/0.25 MG-25 MCG tab; Take 1 tablet by mouth daily.  Dispense: 28 tablet; Refill: 12   Return in about 1 year (around 12/12/2024) for annual well-woman exam.  Future Appointments  Date Time Provider Department Center  02/07/2024  9:30 AM Iloabachie, Chioma E, NP [facility] None   Due to language barrier, a Spanish interpreter Edgar Goods T.) was present in person during the history-taking, subsequent discussion, and physical exam with this patient.   Merleen Stare, NP

## 2024-02-07 ENCOUNTER — Other Ambulatory Visit: Payer: Self-pay

## 2024-02-07 ENCOUNTER — Ambulatory Visit: Payer: Self-pay | Admitting: Gerontology

## 2024-02-07 ENCOUNTER — Encounter: Payer: Self-pay | Admitting: Gerontology

## 2024-02-07 VITALS — BP 106/72 | HR 69 | Ht 62.0 in | Wt 158.5 lb

## 2024-02-07 DIAGNOSIS — E785 Hyperlipidemia, unspecified: Secondary | ICD-10-CM

## 2024-02-07 DIAGNOSIS — Z0000 Encounter for general adult medical examination without abnormal findings: Secondary | ICD-10-CM

## 2024-02-07 MED ORDER — SIMVASTATIN 20 MG PO TABS
20.0000 mg | ORAL_TABLET | Freq: Every day | ORAL | 1 refills | Status: AC
  Filled 2024-02-07 – 2024-03-12 (×2): qty 90, 90d supply, fill #0

## 2024-02-07 NOTE — Patient Instructions (Signed)
 Plan de alimentacin cardiosaludable Heart-Healthy Eating Plan Muchos factores influyen en la salud cardaca, entre ellos, los hbitos de alimentacin y de ejercicio fsico. La salud del corazn tambin se denomina salud coronaria. El riesgo coronario aumenta cuando hay niveles anormales de grasa (lpidos) en la [place]. Un plan de alimentacin cardiosaludable implica limitar las grasas poco saludables, aumentar las grasas saludables, limitar el consumo de sal (sodio) y hacer otros cambios en la dieta y el estilo de [place]. En qu consiste el plan? El mdico podra recomendarle que haga lo siguiente: Que limite la ingesta de grasas al ________ % o menos del total de caloras por da. Que limite la ingesta de grasas saturadas al _________ % o menos del total de caloras por da. Que limite la cantidad de colesterol en su dieta a menos de _________ mg por da. Que limite la cantidad de sodio en su dieta a menos de _________ mg por da. Cules son algunos consejos para seguir este plan? Al cocinar Evite frer los alimentos a la hora de la coccin. Hornear, hervir, grillar y asar a la parrilla son buenas opciones. Otras formas de reducir el consumo de grasas son las siguientes: Quite la piel de las aves. Quite todas las grasas visibles de las carnes. Cocine al vapor las verduras en agua o caldo. Planificacin de las comidas  En las comidas, imagine dividir su plato en cuartos: Llene la mitad del plato con verduras y ensaladas de hojas verdes. Llene un cuarto del plato con cereales integrales. Llene un cuarto del plato con alimentos con protenas magras. Coma de 2 a 4 tazas de [place]. Una taza de verduras equivale a 1 taza (91 g) de brcoli o coliflor, 2 zanahorias medianas, 1 pimiento morrn grande, 1 batata grande, 1 tomate grande, 1 patata blanca mediana, 2 tazas (150 g) de verduras de [employer] crudas. Coma de 1 a 2 tazas de Radiation protection practitioner. Una taza de fruta equivale a 1 manzana  pequea, 1 banana grande, 1 taza (237 g) de fruta mixta, 1 naranja grande,  taza (82 g) de fruta seca, 1 taza (240 ml) de jugo de fruta al 100 %. Consuma ms alimentos con fibra soluble. Entre ellos, se incluyen las [place], el [place], las [place], los frijoles, los guisantes y Aeronautical engineer. Trate de consumir de 25 a 30 g de [employer]. Aumente el consumo de legumbres, frutos secos y semillas a 4 o 5 porciones por semana. Una porcin de frijoles secos o legumbres equivale a 1?4 taza (90 g) cocinados, 1 porcin de frutos secos equivale a  oz (12 almendras, 24 pistachos o 7 mitades de nuez) y 1 porcin de semillas equivale a  oz (8 g). Grasas Elija grasas saludables con mayor frecuencia. Elija las grasas monoinsaturadas y [address], como el aceite de oliva y canola, el aceite de [place], la semillas de [place], las nueces, las almendras y las semillas. Consuma ms grasas omega-3. Elija salmn, caballa, sardinas, atn, aceite de lino y semillas de lino molidas. Propngase comer pescado al [employer] veces por semana. Lea las etiquetas de los alimentos detenidamente para identificar los que contienen grasas trans o altas cantidades de grasas saturadas. Limite el consumo de grasas saturadas. Estas se encuentran en productos de origen animal, como carnes, mantequilla y crema. Las grasas saturadas de origen vegetal incluyen aceite de palma, de palmiste y de coco. Evite los alimentos con aceites parcialmente hidrogenados. Estos contienen grasas trans. [address] [place], se incluyen margarinas en barra, [employer]  margarinas untables, galletas dulces y saladas y otros productos horneados. Evite las comidas fritas. Informacin general Consuma ms comida casera y menos de restaurante, de bares y comida rpida. Limite o evite el alcohol. Limite los alimentos con alto contenido de International aid/development worker aadido y almidones simples, como los alimentos elaborados con harina blanca refinada (pan blanco, pasteles,  dulces). Baje de peso si es necesario. Perder solo del 5 al 10 % de su peso corporal puede ayudarlo a mejorar su estado de salud general y a Education officer, museum, como la diabetes y las enfermedades cardacas. Controle la ingesta de sodio, especialmente si tiene presin arterial alta. Hable con el mdico acerca de cambiar la ingesta de sodio. Intente incorporar ms comidas vegetarianas cada semana. Qu alimentos debo comer? Nils Pyle Nils Pyle frescas, en conserva (en su jugo natural) o frutas congeladas. Verduras Verduras frescas o congeladas (crudas, al vapor, asadas o grilladas). Ensaladas de hojas verdes. Cereales La mayora de los cereales. Elija casi siempre trigo integral y Radiation protection practitioner. Arroz y pastas, incluido el arroz integral y las pastas elaboradas con trigo integral. Armed forces operational officer y otras protenas [place] magras de res, ternera, cerdo y cordero a las que se les haya quitado la grasa visible. Pollo y pavo sin piel. Todos los pescados y [employer]. Pato salvaje, conejo, faisn y venado. Claras de huevo o sustitutos del huevo bajos en colesterol. Porotos, guisantes y lentejas secos y tofu. Semillas y la mayora de los frutos secos. Lcteos Quesos descremados y semidescremados, entre ellos, ricota y Garment/textile technologist. Leche descremada o al 1 % (lquida, en polvo o evaporada). Suero de [employer] elaborado con [employer]. Yogur descremado o semidescremado. Grasas y Writer no hidrogenadas (sin grasas trans). Aceites vegetales, incluido el de soja, ssamo, girasol, [place], [place], man, crtamo, maz, canola y semillas de algodn. Alios para ensalada o mayonesa elaborados con aceite vegetal. Halina Andreas (mineral o con gas). T y caf. T helado sin endulzar. Bebidas dietticas. Dulces y [employer], gelatina y helado de frutas. Pequeas cantidades de chocolate amargo. Limite todos los dulces y postres. Alios y [employer] y condimentos. Es posible que los  productos que se enumeran ms [place] no constituyan una lista completa de los alimentos y las bebidas que puede tomar. Consulte a un nutricionista para conocer ms opciones. Qu alimentos debo evitar? Nils Pyle Fruta enlatada en almbar espeso. Frutas con salsa de crema o mantequilla. Frutas fritas. Limite el consumo de coco. Verduras Verduras cocinadas con salsas de queso, crema o mantequilla. Verduras fritas. Cereales Panes elaborados con grasas saturadas o trans, aceites o [employer]ak. Croissants. Panecillos dulces. Rosquillas. Galletas con alto contenido de grasas, como las que galletas de queso y las papas fritas. Carnes y [address] protenas [address] grasas, como perros calientes, [place] de res, salchichas, tocino, asado de Producer, television/film/video o [place]. Fiambres con alto contenido de [place], como salame y Loss adjuster, chartered. Caviar. Pato y ganso domsticos. Vsceras, como hgado. Lcteos Crema, crema agria, queso crema y [place] con crema. Quesos enteros. Leche entera o al 2 % (lquida, evaporada o condensada). Suero de [employer]. Salsa de crema o queso con alto contenido de [place]. Yogur de East[employer] Kodak. Grasas y [place] de carne o materia grasa. Manteca de cacao, aceites hidrogenados, aceite de palma, aceite de coco, aceite de palmiste. Grasas y [address] grasas slidas, incluida la grasa del tocino, el cerdo [place], la [place] de cerdo y Civil engineer, contracting. Sustitutos no lcteos de la crema. Aderezos para ensalada con queso o crema agria. Bebidas Refrescos regulares y cualquier bebida  con agregado de azcar. Dulces y [employer]. Pudin. Galletas dulces. Bizcochuelos. Pasteles. Chocolate con leche o chocolate blanco. Almbares con mantequilla. Helados o bebidas elaboradas con helado con alto contenido de grasas. Es posible que los productos que se enumeran ms arriba no sean una lista completa de los alimentos y las bebidas que se Theatre stage manager. Consulte a un nutricionista para obtener ms  informacin. Resumen La planificacin de comidas cardiosaludables implica limitar las grasas poco saludables, aumentar las grasas saludables, limitar el consumo de sal (sodio) y Radio producer otros cambios en la dieta y el estilo de [place]. Baje de peso si es necesario. Perder solo del 5 al 10 % de su peso corporal puede ayudarlo a mejorar su estado de salud general y a Education officer, museum, como la diabetes y las enfermedades cardacas. Propngase seguir una dieta equilibrada, que incluya frutas y verduras, productos lcteos descremados o semidescremados, protenas magras, frutos secos y legumbres, cereales integrales y aceites y grasas cardiosaludables. Esta informacin no tiene Theme park manager el consejo del mdico. Asegrese de hacerle al mdico cualquier pregunta que tenga. Document Revised: 08/31/2021 Document Reviewed: 08/31/2021 Elsevier Patient Education  2024 [employer].

## 2024-02-07 NOTE — Progress Notes (Signed)
 Established Patient Office Visit  Subjective   Patient ID: Jordan Mccarthy, female    DOB: Jan 13, 1986  Age: 38 y.o. MRN: [id number]  Chief Complaint  Patient presents with   Follow-up   Pacific Interpreter was used during visit. HPI  Jordan Mccarthy is a 38 year old female with a history of elevated LDL who presents for routine follow up visit. Jordan Mccarthy states that Jordan Mccarthy's compliant with her medications, denies side effects and continues to make healthy lifestyle changes. Jordan Mccarthy denies chest pain, palpitation, light headedness and vision changes. Overall, Jordan Mccarthy states that Jordan Mccarthy's doing well and offers no further complaint.  Review of Systems  Constitutional: Negative.   Eyes: Negative.   Respiratory: Negative.    Cardiovascular: Negative.   Genitourinary: Negative.   Skin: Negative.   Neurological: Negative.   Endo/Heme/Allergies: Negative.   Psychiatric/Behavioral: Negative.        Objective:     BP 106/72 (BP Location: Left Arm, Patient Position: Sitting, Cuff Size: Small)   Pulse 69   Ht 5' 2 (1.575 m)   Wt 158 lb 8 oz (71.9 kg)   LMP 02/07/2024 (Approximate)   SpO2 98%   BMI 28.99 kg/m  BP Readings from Last 3 Encounters:  02/07/24 106/72  12/13/23 102/64  11/15/23 102/67   Wt Readings from Last 3 Encounters:  02/07/24 158 lb 8 oz (71.9 kg)  12/13/23 156 lb (70.8 kg)  11/15/23 156 lb 4.8 oz (70.9 kg)      Physical Exam HENT:     Head: Normocephalic and atraumatic.     Mouth/Throat:     Mouth: Mucous membranes are moist.     Pharynx: Oropharynx is clear.  Eyes:     Extraocular Movements: Extraocular movements intact.     Pupils: Pupils are equal, round, and reactive to light.  Cardiovascular:     Rate and Rhythm: Normal rate and regular rhythm.     Pulses: Normal pulses.     Heart sounds: Normal heart sounds.  Pulmonary:     Effort: Pulmonary effort is normal.     Breath sounds: Normal breath sounds.  Skin:    General: Skin is warm.  Neurological:      General: No focal deficit present.     Mental Status: Jordan Mccarthy is alert and oriented to person, place, and time.  Psychiatric:        Mood and Affect: Mood normal.        Behavior: Behavior normal.      No results found for any visits on 02/07/24.  Last CBC Lab Results  Component Value Date   WBC 8.1 05/09/2023   HGB 11.8 05/09/2023   HCT 36.6 05/09/2023   MCV 90 05/09/2023   MCH 28.9 05/09/2023   RDW 12.9 05/09/2023   PLT 395 05/09/2023   Last metabolic panel Lab Results  Component Value Date   GLUCOSE 95 01/18/2023   NA 140 01/18/2023   K 4.3 01/18/2023   CL 104 01/18/2023   CO2 24 01/18/2023   BUN 13 01/18/2023   CREATININE 0.68 01/18/2023   EGFR 116 01/18/2023   CALCIUM 9.2 01/18/2023   PROT 7.4 01/18/2023   ALBUMIN 4.3 01/18/2023   LABGLOB 3.1 01/18/2023   AGRATIO 1.4 02/02/2022   BILITOT 0.3 01/18/2023   ALKPHOS 59 01/18/2023   AST 15 01/18/2023   ALT 11 01/18/2023   ANIONGAP 9 09/13/2020   Last lipids Lab Results  Component Value Date   CHOL 190 11/06/2023   HDL 50  11/06/2023   LDLCALC 120 (H) 11/06/2023   TRIG 109 11/06/2023   CHOLHDL 3.8 11/06/2023   Last hemoglobin A1c Lab Results  Component Value Date   HGBA1C 5.4 01/18/2023   Last thyroid functions Lab Results  Component Value Date   TSH 0.624 05/09/2023   Last vitamin D  Lab Results  Component Value Date   VD25OH 58.8 08/08/2023   Last vitamin B12 and Folate No results found for: VITAMINB12, FOLATE    The ASCVD Risk score (Arnett DK, et al., 2019) failed to calculate for the following reasons:   The 2019 ASCVD risk score is only valid for ages 5 to 46    Assessment & Plan:   1. Elevated lipids (Primary) - Changed her medication to Simvastatin  so it will be free at [facility]. Jordan Mccarthy was educate on medication side effects and not notify clinic. Jordan Mccarthy was encouraged to continue on low fat/cholesterol diet and exercise as tolerated. - Lipid panel; Future  2. Health care  maintenance - Routine labs will be checked. - CBC w/Diff; Future - Comp Met (CMET); Future - HgB A1c; Future - TSH; Future - Iron  Binding Cap (TIBC)([facility]/Sun[facility]); Future   Return in about 14 weeks (around 05/15/2024), or if symptoms worsen or fail to improve, for Lab 05/06/24, F/U 05/15/24 in clinic, wants Eye exam.    Lazaro Isenhower E Gee Habig, NP

## 2024-02-18 ENCOUNTER — Other Ambulatory Visit: Payer: Self-pay

## 2024-03-12 ENCOUNTER — Other Ambulatory Visit: Payer: Self-pay

## 2024-05-06 ENCOUNTER — Other Ambulatory Visit: Payer: Self-pay

## 2024-05-15 ENCOUNTER — Ambulatory Visit: Payer: Self-pay | Admitting: Gerontology

## 2024-06-10 ENCOUNTER — Encounter: Payer: Self-pay | Admitting: Gerontology

## 2024-06-10 ENCOUNTER — Other Ambulatory Visit: Payer: Self-pay

## 2024-06-10 ENCOUNTER — Ambulatory Visit: Payer: Self-pay | Admitting: Gerontology

## 2024-06-10 VITALS — BP 119/85 | HR 79 | Wt 157.0 lb

## 2024-06-10 DIAGNOSIS — E785 Hyperlipidemia, unspecified: Secondary | ICD-10-CM

## 2024-06-10 DIAGNOSIS — K029 Dental caries, unspecified: Secondary | ICD-10-CM

## 2024-06-10 DIAGNOSIS — H9311 Tinnitus, right ear: Secondary | ICD-10-CM

## 2024-06-10 DIAGNOSIS — R0981 Nasal congestion: Secondary | ICD-10-CM | POA: Insufficient documentation

## 2024-06-10 DIAGNOSIS — R059 Cough, unspecified: Secondary | ICD-10-CM | POA: Insufficient documentation

## 2024-06-10 DIAGNOSIS — Z0000 Encounter for general adult medical examination without abnormal findings: Secondary | ICD-10-CM

## 2024-06-10 DIAGNOSIS — R051 Acute cough: Secondary | ICD-10-CM

## 2024-06-10 DIAGNOSIS — H9319 Tinnitus, unspecified ear: Secondary | ICD-10-CM | POA: Insufficient documentation

## 2024-06-10 MED ORDER — SALINE SPRAY 0.65 % NA SOLN
1.0000 | NASAL | 0 refills | Status: AC | PRN
  Filled 2024-06-10: qty 44, 30d supply, fill #0

## 2024-06-10 MED ORDER — BENZONATATE 200 MG PO CAPS
200.0000 mg | ORAL_CAPSULE | Freq: Three times a day (TID) | ORAL | 0 refills | Status: DC | PRN
  Filled 2024-06-10: qty 20, 7d supply, fill #0

## 2024-06-10 MED ORDER — AMOXICILLIN-POT CLAVULANATE 875-125 MG PO TABS
1.0000 | ORAL_TABLET | Freq: Two times a day (BID) | ORAL | 0 refills | Status: DC
  Filled 2024-06-10: qty 6, 3d supply, fill #0

## 2024-06-10 NOTE — Progress Notes (Signed)
 Established Patient Office Visit  Subjective   Patient ID: Jordan Mccarthy, female    DOB: 07/20/1986  Age: 38 y.o. MRN: [id number]  Chief Complaint  Patient presents with   Follow-up   Tinnitus   Pacific Interpreter was used during visit  HPI  Jordan Mccarthy is a 38 year old female with a history of elevated LDL who presents for routine follow up visit. She states that she's compliant with her medications, denies side effects and continues to make healthy lifestyle changes. Currently, she c/o tinnitus to right ear that has been going on for 1 month. She denies otalgia,  otorrhea, sore throat, endorses rhinorrhea, nasal congestion, non productive cough that has been going on since Sunday and it wakes her up at night. She has contact with her child with her at her appointment that is coughing . She also c/o tooth ache to lower mandibule, cavity,  though the tooth was extracted last weekend, but c/o swelling, pain to left mandible. Overall, she states that she's doing well and offers no further complaints.    Review of Systems  Constitutional: Negative.   HENT:  Positive for congestion and tinnitus. Negative for ear discharge, ear pain, sinus pain and sore throat.   Respiratory:  Positive for cough. Negative for sputum production, shortness of breath and wheezing.   Cardiovascular: Negative.   Skin: Negative.       Objective:     BP 119/85   Pulse 79   Wt 157 lb (71.2 kg)   LMP 05/29/2024   SpO2 99%   BMI 28.72 kg/m  BP Readings from Last 3 Encounters:  06/10/24 119/85  02/07/24 106/72  12/13/23 102/64   Wt Readings from Last 3 Encounters:  06/10/24 157 lb (71.2 kg)  02/07/24 158 lb 8 oz (71.9 kg)  12/13/23 156 lb (70.8 kg)      Physical Exam HENT:     Head: Normocephalic and atraumatic.     Right Ear: Hearing and ear canal normal. No drainage or tenderness. No middle ear effusion. There is no impacted cerumen. No mastoid tenderness. Tympanic membrane is not  erythematous.     Left Ear: Hearing and ear canal normal. No drainage or tenderness.  No middle ear effusion. There is no impacted cerumen. No mastoid tenderness. Tympanic membrane is not erythematous.     Nose: No nasal tenderness, mucosal edema, congestion or rhinorrhea.     Right Sinus: Maxillary sinus tenderness present. No frontal sinus tenderness.     Left Sinus: Maxillary sinus tenderness present. No frontal sinus tenderness.     Mouth/Throat:     Lips: Pink.     Mouth: Mucous membranes are moist.     Dentition: Dental caries and dental abscesses (left lower mandible) present.     Pharynx: Oropharynx is clear. Uvula midline. No pharyngeal swelling, oropharyngeal exudate, posterior oropharyngeal erythema, uvula swelling or postnasal drip.     Tonsils: No tonsillar exudate or tonsillar abscesses.  Cardiovascular:     Rate and Rhythm: Normal rate and regular rhythm.     Pulses: Normal pulses.     Heart sounds: Normal heart sounds.  Pulmonary:     Effort: Pulmonary effort is normal.     Breath sounds: Normal breath sounds.  Skin:    General: Skin is warm.     Capillary Refill: Capillary refill takes less than 2 seconds.  Neurological:     General: No focal deficit present.     Mental Status: She is alert and  oriented to person, place, and time.  Psychiatric:        Mood and Affect: Mood normal.        Behavior: Behavior normal.      No results found for any visits on 06/10/24.  Last CBC Lab Results  Component Value Date   WBC 8.1 05/09/2023   HGB 11.8 05/09/2023   HCT 36.6 05/09/2023   MCV 90 05/09/2023   MCH 28.9 05/09/2023   RDW 12.9 05/09/2023   PLT 395 05/09/2023   Last metabolic panel Lab Results  Component Value Date   GLUCOSE 95 01/18/2023   NA 140 01/18/2023   K 4.3 01/18/2023   CL 104 01/18/2023   CO2 24 01/18/2023   BUN 13 01/18/2023   CREATININE 0.68 01/18/2023   EGFR 116 01/18/2023   CALCIUM 9.2 01/18/2023   PROT 7.4 01/18/2023   ALBUMIN 4.3  01/18/2023   LABGLOB 3.1 01/18/2023   AGRATIO 1.4 02/02/2022   BILITOT 0.3 01/18/2023   ALKPHOS 59 01/18/2023   AST 15 01/18/2023   ALT 11 01/18/2023   ANIONGAP 9 09/13/2020   Last lipids Lab Results  Component Value Date   CHOL 190 11/06/2023   HDL 50 11/06/2023   LDLCALC 120 (H) 11/06/2023   TRIG 109 11/06/2023   CHOLHDL 3.8 11/06/2023   Last hemoglobin A1c Lab Results  Component Value Date   HGBA1C 5.4 01/18/2023   Last thyroid functions Lab Results  Component Value Date   TSH 0.624 05/09/2023   Last vitamin D  Lab Results  Component Value Date   VD25OH 58.8 08/08/2023   Last vitamin B12 and Folate No results found for: VITAMINB12, FOLATE    The ASCVD Risk score (Arnett DK, et al., 2019) failed to calculate for the following reasons:   The 2019 ASCVD risk score is only valid for ages 25 to 75    Assessment & Plan:   1. Health care maintenance - Routine labs will be checked. - Iron  Binding Cap (TIBC)([facility]) - TSH - HgB A1c - Comp Met (CMET) - CBC w/Diff  2. Elevated lipids - Will recheck Lipids - Lipid panel  3. Acute cough (Primary) - She was started on Benzonatate, educated on medication side effects and notify clinic for worsening symptoms. - benzonatate (TESSALON) 200 MG capsule; Take 1 capsule (200 mg total) by mouth 3 (three) times daily as needed for cough.  Dispense: 20 capsule; Refill: 0  4. Nasal congestion -She agreed to start on Nasal saline,  - sodium chloride  (OCEAN) 0.65 % SOLN nasal spray; Place 1 spray into both nostrils as needed for congestion.  Dispense: 44 mL; Refill: 0  5. Dental caries - Swelling to left lower mandible and mild erythema, started on Augmentin, educated on medication side effects and advised to notify clinic. - amoxicillin-clavulanate (AUGMENTIN) 875-125 MG tablet; Take 1 tablet by mouth 2 (two) times daily.  Dispense: 6 tablet; Refill: 0  6. Tinnitus of right ear - Likely due to left lower  mandible pain, normal ear, she was advised to notify clinic for worsening symptoms.   Return in about 4 weeks (around 07/08/2024), or if symptoms worsen or fail to improve.    Virgilene Stryker E Evolet Salminen, NP

## 2024-06-11 LAB — LIPID PANEL
Chol/HDL Ratio: 3.7 ratio (ref 0.0–4.4)
Cholesterol, Total: 216 mg/dL — ABNORMAL HIGH (ref 100–199)
HDL: 59 mg/dL (ref >39)
LDL Chol Calc (NIH): 143 mg/dL — ABNORMAL HIGH (ref 0–99)
Triglycerides: 81 mg/dL (ref 0–149)
VLDL Cholesterol Cal: 14 mg/dL (ref 5–40)

## 2024-06-11 LAB — COMPREHENSIVE METABOLIC PANEL WITH GFR
ALT: 13 IU/L (ref 0–32)
AST: 18 IU/L (ref 0–40)
Albumin: 4.3 g/dL (ref 3.9–4.9)
Alkaline Phosphatase: 58 IU/L (ref 41–116)
BUN/Creatinine Ratio: 15 (ref 9–23)
BUN: 11 mg/dL (ref 6–20)
Bilirubin Total: 0.3 mg/dL (ref 0.0–1.2)
CO2: 21 mmol/L (ref 20–29)
Calcium: 9.2 mg/dL (ref 8.7–10.2)
Chloride: 100 mmol/L (ref 96–106)
Creatinine, Ser: 0.74 mg/dL (ref 0.57–1.00)
Globulin, Total: 3.3 g/dL (ref 1.5–4.5)
Glucose: 83 mg/dL (ref 70–99)
Potassium: 4.4 mmol/L (ref 3.5–5.2)
Sodium: 137 mmol/L (ref 134–144)
Total Protein: 7.6 g/dL (ref 6.0–8.5)
eGFR: 107 mL/min/1.73 (ref >59)

## 2024-06-11 LAB — HEMOGLOBIN A1C
Est. average glucose Bld gHb Est-mCnc: 103 mg/dL
Hgb A1c MFr Bld: 5.2 % (ref 4.8–5.6)

## 2024-06-11 LAB — CBC WITH DIFFERENTIAL/PLATELET
Basophils Absolute: 0 x10E3/uL (ref 0.0–0.2)
Basos: 1 %
EOS (ABSOLUTE): 0.1 x10E3/uL (ref 0.0–0.4)
Eos: 1 %
Hematocrit: 37.3 % (ref 34.0–46.6)
Hemoglobin: 11.8 g/dL (ref 11.1–15.9)
Immature Grans (Abs): 0 x10E3/uL (ref 0.0–0.1)
Immature Granulocytes: 0 %
Lymphocytes Absolute: 2 x10E3/uL (ref 0.7–3.1)
Lymphs: 31 %
MCH: 28.6 pg (ref 26.6–33.0)
MCHC: 31.6 g/dL (ref 31.5–35.7)
MCV: 91 fL (ref 79–97)
Monocytes Absolute: 0.6 x10E3/uL (ref 0.1–0.9)
Monocytes: 9 %
Neutrophils Absolute: 3.7 x10E3/uL (ref 1.4–7.0)
Neutrophils: 58 %
Platelets: 317 x10E3/uL (ref 150–450)
RBC: 4.12 x10E6/uL (ref 3.77–5.28)
RDW: 12.9 % (ref 11.7–15.4)
WBC: 6.4 x10E3/uL (ref 3.4–10.8)

## 2024-06-11 LAB — IRON AND TIBC
Iron Saturation: 5 % — CL (ref 15–55)
Iron: 20 ug/dL — ABNORMAL LOW (ref 27–159)
Total Iron Binding Capacity: 385 ug/dL (ref 250–450)
UIBC: 365 ug/dL (ref 131–425)

## 2024-06-11 LAB — TSH: TSH: 1.08 u[IU]/mL (ref 0.450–4.500)

## 2024-06-17 ENCOUNTER — Telehealth: Payer: Self-pay | Admitting: Gerontology

## 2024-06-17 NOTE — Telephone Encounter (Signed)
 Called patient for lab review via [employer], LVM to call back.

## 2024-07-08 ENCOUNTER — Ambulatory Visit: Payer: Self-pay | Admitting: Gerontology

## 2024-07-10 ENCOUNTER — Ambulatory Visit: Payer: Self-pay

## 2024-07-10 ENCOUNTER — Ambulatory Visit: Payer: Self-pay | Admitting: Gerontology

## 2024-07-10 ENCOUNTER — Other Ambulatory Visit: Payer: Self-pay

## 2024-07-10 MED ORDER — SENNOSIDES-DOCUSATE SODIUM 8.6-50 MG PO TABS
1.0000 | ORAL_TABLET | Freq: Every day | ORAL | 0 refills | Status: AC
  Filled 2024-07-10: qty 90, 90d supply, fill #0

## 2024-07-10 MED ORDER — IRON (FERROUS SULFATE) 325 (65 FE) MG PO TABS
1.0000 | ORAL_TABLET | Freq: Every day | ORAL | 0 refills | Status: AC
  Filled 2024-07-10: qty 60, 60d supply, fill #0

## 2024-07-10 NOTE — Addendum Note (Signed)
 Addended by: Pierrette Scheu E on: 07/10/2024 10:27 AM   Modules accepted: Orders

## 2024-07-11 ENCOUNTER — Ambulatory Visit: Payer: Self-pay

## 2024-07-11 ENCOUNTER — Ambulatory Visit (LOCAL_COMMUNITY_HEALTH_CENTER): Payer: Self-pay | Admitting: Family Medicine

## 2024-07-11 VITALS — BP 108/68 | HR 67 | Wt 156.8 lb

## 2024-07-11 DIAGNOSIS — Z113 Encounter for screening for infections with a predominantly sexual mode of transmission: Secondary | ICD-10-CM

## 2024-07-11 DIAGNOSIS — Z30430 Encounter for insertion of intrauterine contraceptive device: Secondary | ICD-10-CM

## 2024-07-11 DIAGNOSIS — Z309 Encounter for contraceptive management, unspecified: Secondary | ICD-10-CM

## 2024-07-11 LAB — WET PREP FOR TRICH, YEAST, CLUE
Clue Cell Exam: NEGATIVE
Trichomonas Exam: NEGATIVE
Yeast Exam: NEGATIVE

## 2024-07-11 LAB — HM HIV SCREENING LAB: HM HIV Screening: NEGATIVE

## 2024-07-11 MED ORDER — LEVONORGESTREL 20.1 MCG/DAY IU IUD
1.0000 | INTRAUTERINE_SYSTEM | Freq: Once | INTRAUTERINE | Status: AC
  Administered 2024-07-11: 1 via INTRAUTERINE

## 2024-07-11 NOTE — Progress Notes (Signed)
 Pt is here for IUD indertion. Wet prep results reviewed with patient and requires no treatment per SO. IUD inserted successfully into the Uterus by Verneta Bers, FNP and pt tolerated well to the process with no complications. Opportunity given to patient to ask questions for any clarifications. Questions answered.  Condoms declined. Kwadwo Chaitra Mast,RN.

## 2024-07-11 NOTE — Progress Notes (Signed)
" °  [facility] [address], [address] [place] [address] Main phone: [phone]  Women's Health Problem Visit   Subjective:  Jordan Mccarthy is a 38 y.o. being seen today for IUD insertion.   Chief Complaint  Patient presents with   Acute Visit    BCM change to IUD    HPI  Does the patient have a current or past history of drug use? No   No components found for: HCV]  Health Maintenance Due  Topic Date Due   COVID-19 Vaccine (1) Never done   Hepatitis B Vaccines 19-59 Average Risk (1 of 3 - 19+ 3-dose series) Never done   HPV VACCINES (1 - 3-dose SCDM series) Never done   Influenza Vaccine  02/22/2024    ROS  The following portions of the patient's history were reviewed and updated as appropriate: allergies, current medications, past family history, past medical history, past social history, past surgical history and problem list. Problem list updated.  See flowsheet for other program required questions.  Objective:   Vitals:   07/11/24 1044  BP: 108/68  Pulse: 67  Weight: 156 lb 12.8 oz (71.1 kg)    Physical Exam Exam conducted with a chaperone present Emmie Norway).  Genitourinary:    Exam position: Lithotomy position.     Tanner stage (genital): 5.     Vagina: Normal.     Cervix: Normal.     Assessment and Plan:  Jordan Mccarthy is a 38 y.o. female presenting to the [facility] Department for a Women's Health problem visit  1. Encounter for IUD insertion (Primary) Procedure:  IUD Insertion  Patient presented to [facility] for IUD insertion. Her GC/CT screening was found to be up to date and using WHO criteria we can be reasonably certain she is not pregnant or a pregnancy test was obtained which was Urine pregnancy test  today was N/A.  See Flowsheet for IUD check list.   IUD Insertion Procedure Note Patient identified, informed consent performed, consent signed.   Discussed  risks of irregular bleeding, cramping, infection, malpositioning or misplacement of the IUD outside the uterus which may require further procedure such as laparoscopy. Time out was performed.    Speculum placed in the vagina.  Cervix visualized.  Cleaned with Betadine x 2.  Grasped anteriorly with a single tooth tenaculum.  Uterus sounded to 7 cm.  IUD placed per manufacturer's recommendations.  Strings trimmed to 3 cm. Tenaculum was removed, good hemostasis noted.  Patient tolerated procedure well.   Patient was given post-procedure instructions- both agency handout and verbally by provider.  She was advised to have backup contraception for one week.  Patient was also asked to check IUD strings periodically or follow up in 4 weeks for IUD check.  2. Screening for venereal disease - Chlamydia/Gonorrhea [place] [facility] - [facility] - Syphilis Serology, [facility] - WET PREP FOR TRICH, YEAST, CLUE  Return in about 4 weeks (around 08/08/2024) for IUD string check.  No future appointments. Due to language barrier, a Spanish interpreter Emmie SAILOR.) was present in person during the history-taking, subsequent discussion, and physical exam with this patient.   Verneta Bers, [place] "

## 2024-07-15 ENCOUNTER — Ambulatory Visit: Payer: Self-pay

## 2024-07-15 DIAGNOSIS — N898 Other specified noninflammatory disorders of vagina: Secondary | ICD-10-CM

## 2024-07-15 DIAGNOSIS — Z30431 Encounter for routine checking of intrauterine contraceptive device: Secondary | ICD-10-CM

## 2024-07-15 LAB — WET PREP FOR TRICH, YEAST, CLUE
Clue Cell Exam: NEGATIVE
Trichomonas Exam: NEGATIVE
Yeast Exam: NEGATIVE

## 2024-07-15 NOTE — Progress Notes (Signed)
 Pt here due to vaginal discharge.  Wet mount results reviewed with patient.  No treatment needed at this time as per standing orders.  Will contact patient if any positive results from 07-11-24 visit. -Omayra Tulloch, RN

## 2024-07-15 NOTE — Progress Notes (Signed)
 " [facility] phone: [phone]  Women's Health Problem Visit   Subjective:  Jordan Mccarthy is a 38 y.o. being seen today for IUD check.   Chief Complaint  Patient presents with   SEXUALLY TRANSMITTED DISEASE   HPI: Patient reports she had an IUD placed on 07/11/24 and Sunday, had a clot of tissue come out of her vagina. No pain or cramping. Has had some odor.  Showed a picture of of a yellow-tinged sac with black-looking tissue inside. She says it was watery and filled with a black crumb like substance and around the size of a pen. LMP before IUD insertion was 12/6 and was a normal period.  IUD was insertion on 12/19 and she had not had any sex between her LMP and insertion. No history of fibroids, polyps, uterine surgeries or other gyn abnormalities.  Health Maintenance Due  Topic Date Due   COVID-19 Vaccine (1) Never done   Hepatitis B Vaccines 19-59 Average Risk (1 of 3 - 19+ 3-dose series) Never done   HPV VACCINES (1 - 3-dose SCDM series) Never done   Influenza Vaccine  02/22/2024   Review of Systems  All other systems reviewed and are negative.  The following portions of the patient's history were reviewed and updated as appropriate: allergies, current medications, past family history, past medical history, past social history, past surgical history and problem list. Problem list updated.  See flowsheet for other program required questions.  Objective:  There were no vitals filed for this visit.  Physical Exam Vitals and nursing note reviewed. Exam conducted with a chaperone present [facility]).  Constitutional:      Appearance: Normal appearance.  HENT:     Head: Normocephalic and atraumatic.     Comments: No nits or hair loss of scalp, brows, and lashes Eyes:     General:        Right eye: No discharge.        Left eye: No discharge.      Conjunctiva/sclera: Conjunctivae normal.     Right eye: Right conjunctiva is not injected.     Left eye: Left conjunctiva is not injected.  Pulmonary:     Effort: Pulmonary effort is normal.  Abdominal:     Tenderness: There is no abdominal tenderness. There is no rebound.  Genitourinary:    General: Normal vulva.     Exam position: Lithotomy position.     Pubic Area: No rash or pubic lice.      Labia:        Right: No rash or lesion.        Left: No rash or lesion.      Vagina: Normal. No vaginal discharge, erythema or lesions.     Cervix: No cervical motion tenderness, discharge, lesion or erythema.     Comments: IUD strings visualized  Lymphadenopathy:     Cervical: No cervical adenopathy.     Upper Body:     Right upper body: No supraclavicular adenopathy.     Left upper body: No supraclavicular adenopathy.  Skin:    General: Skin is warm and dry.     Findings: No lesion or rash.  Neurological:     Mental Status: She is alert and oriented to person, place, and time.    Assessment and Plan:  Jordan Mccarthy is a 38 y.o. female presenting to the [facility] Department for  a Women's Health problem visit  1. IUD check up (Primary)  - IUD visualized - No abnormalities on speculum exam. No tissue, foreign bodies. Cervical os closed.  - US  pelvic complete with transvaginal ordered to check IUD placement and for any uterine structural abnormalities that may explain expulsion of unknown tissue 3 days post IUD insertion, such as fibroids, polyps etc that may have been disturbed during IUD placement.  2. Vaginal odor  - WET PREP FOR TRICH, YEAST, CLUE negative  Return if symptoms worsen or fail to improve.  Due to language barrier, a Spanish interpreter [facility].) was present in person during the history-taking, subsequent discussion, and physical exam with this patient.    No future appointments.  Jordan Jordan Mccarthy Satchel, Jordan Mccarthy "

## 2024-07-21 ENCOUNTER — Telehealth: Payer: Self-pay | Admitting: Family Medicine

## 2024-07-22 ENCOUNTER — Ambulatory Visit
Admission: RE | Admit: 2024-07-22 | Discharge: 2024-07-22 | Disposition: A | Discharge status: Home / Self Care | Payer: Self-pay | Source: Ambulatory Visit

## 2024-07-22 DIAGNOSIS — Z30431 Encounter for routine checking of intrauterine contraceptive device: Secondary | ICD-10-CM | POA: Insufficient documentation

## 2024-07-23 NOTE — Telephone Encounter (Signed)
 See call encounter. Larraine JONELLE Novak, RN

## 2024-08-04 ENCOUNTER — Ambulatory Visit: Payer: Self-pay

## 2024-08-04 NOTE — Progress Notes (Signed)
 Reviewed. IUD in correct location.  Damien Satchel NP

## 2024-08-06 ENCOUNTER — Telehealth: Payer: Self-pay | Admitting: Family Medicine

## 2024-08-08 ENCOUNTER — Telehealth: Payer: Self-pay | Admitting: Family Medicine

## 2024-08-08 ENCOUNTER — Telehealth: Payer: Self-pay

## 2024-08-08 NOTE — Telephone Encounter (Signed)
 Larraine JONELLE Novak, RN

## 2024-08-08 NOTE — Telephone Encounter (Signed)
 Returned patient call regarding her US  results. Her US  showed her IUD was in correct location.  She did not answer, call back message left with Spanish interpreter ID [phone].   Damien Satchel [facility]
